# Patient Record
Sex: Male | Born: 1970 | Race: Black or African American | Hispanic: No | Marital: Married | Smoking: Never smoker
Health system: Southern US, Community
[De-identification: ages and names within clinical notes are randomized; demographics above are authoritative.]

## PROBLEM LIST (undated history)

## (undated) DIAGNOSIS — G40909 Epilepsy, unspecified, not intractable, without status epilepticus: Secondary | ICD-10-CM

## (undated) DIAGNOSIS — Z21 Asymptomatic human immunodeficiency virus [HIV] infection status: Secondary | ICD-10-CM

## (undated) DIAGNOSIS — B20 Human immunodeficiency virus [HIV] disease: Secondary | ICD-10-CM

---

## 2015-08-19 ENCOUNTER — Encounter: Payer: Self-pay | Admitting: Emergency Medicine

## 2015-08-19 ENCOUNTER — Ambulatory Visit
Admission: EM | Admit: 2015-08-19 | Discharge: 2015-08-19 | Disposition: A | Discharge status: Home / Self Care | Payer: Self-pay | Attending: Family Medicine | Admitting: Family Medicine

## 2015-08-19 DIAGNOSIS — G40909 Epilepsy, unspecified, not intractable, without status epilepticus: Secondary | ICD-10-CM

## 2015-08-19 DIAGNOSIS — Z21 Asymptomatic human immunodeficiency virus [HIV] infection status: Secondary | ICD-10-CM

## 2015-08-19 DIAGNOSIS — B20 Human immunodeficiency virus [HIV] disease: Secondary | ICD-10-CM

## 2015-08-19 HISTORY — DX: Human immunodeficiency virus (HIV) disease: B20

## 2015-08-19 HISTORY — DX: Epilepsy, unspecified, not intractable, without status epilepticus: G40.909

## 2015-08-19 HISTORY — DX: Asymptomatic human immunodeficiency virus (hiv) infection status: Z21

## 2015-08-19 MED ORDER — DIVALPROEX SODIUM 500 MG PO DR TAB: 500.0000 mg | DELAYED_RELEASE_TABLET | Freq: Two times a day (BID) | ORAL | Status: DC

## 2015-08-19 MED ORDER — DIVALPROEX SODIUM 500 MG PO DR TAB: 500.0000 mg | DELAYED_RELEASE_TABLET | Freq: Three times a day (TID) | ORAL | Status: DC

## 2015-08-19 NOTE — ED Provider Notes (Addendum)
CSN: [id number]     Arrival date & time 08/19/15  1433 History   First MD Initiated Contact with Patient 08/19/15 [phone]      Nurses notes were reviewed.  Chief Complaint  Patient presents with  . Medication Refill   patient is here for medication refill. He's been here for a few months. The left [place] in [place] for a while. Both he and his wife were HIV positive and he does have seizures. He is almost out of his valproic acid medication. Which is a challenge since he normally he takes 300 twice a day. We talked he has been on 500 twice a day before without problems so we are going to go ahead and then placed back on valproic acid. (Consider location/radiation/quality/duration/timing/severity/associated sxs/prior Treatment) HPI  Past Medical History  Diagnosis Date  . Seizure disorder, secondary (HCC)   . HIV (human immunodeficiency virus infection) (HCC)    History reviewed. No pertinent past surgical history. History reviewed. No pertinent family history. Social History  Substance Use Topics  . Smoking status: Never Smoker   . Smokeless tobacco: None  . Alcohol Use: No    Review of Systems  All other systems reviewed and are negative.   Allergies  Review of patient's allergies indicates no known allergies.  Home Medications   Prior to Admission medications   Medication Sig Start Date End Date Taking? Authorizing Provider  divalproex (DEPAKOTE) 500 MG DR tablet Take 1 tablet (500 mg total) by mouth 2 (two) times daily. 08/19/15   Hassan RowanEugene Leslieann Whisman, MD   Meds Ordered and Administered this Visit  Medications - No data to display  BP 124/72 mmHg  Pulse 71  Temp(Src) 97.6 F (36.4 C) (Tympanic)  Resp 16  Ht 5\' 7"  (1.702 m)  Wt 140 lb (63.504 kg)  BMI 21.92 kg/m2  SpO2 100% No data found.   Physical Exam  Constitutional: He is oriented to person, place, and time. He appears well-developed and well-nourished.  HENT:  Head: Normocephalic and atraumatic.  Eyes:  Pupils are equal, round, and reactive to light.  Musculoskeletal: Normal range of motion.  Neurological: He is alert and oriented to person, place, and time.  Skin: Skin is warm.  Psychiatric: He has a normal mood and affect. His behavior is normal.  Vitals reviewed.   ED Course  Procedures (including critical care time)  Labs Review Labs Reviewed - No data to display  Imaging Review No results found.   Visual Acuity Review  Right Eye Distance:   Left Eye Distance:   Bilateral Distance:    Right Eye Near:   Left Eye Near:    Bilateral Near:         MDM   1. Seizure disorder (HCC)   2. HIV (human immunodeficiency virus infection) (HCC)    Patient's follow-up for his HIV with a PCP to get medication at later date.    Hassan RowanEugene Gladis Soley, MD 08/19/15 1554  Hassan RowanEugene Porche Steinberger, MD 08/19/15 [phone]

## 2015-08-19 NOTE — ED Notes (Addendum)
Patient here for refill of medication for his seizures.  Patient states he has come here form [place] 5 months ago. Patient states that he is out of the Epilim CR 600mg  twice a day prescribed by his physician in [place].

## 2015-08-19 NOTE — Discharge Instructions (Signed)
Epilepsy °People with epilepsy have times when they shake and jerk uncontrollably (seizures). This happens when there is a sudden change in brain function. Epilepsy may have many possible causes. Anything that disturbs the normal pattern of brain cell activity can lead to seizures. °HOME CARE  °· Follow your doctor's instructions about driving and safety during normal activities. °· Get enough sleep. °· Only take medicine as told by your doctor. °· Avoid things that you know can cause you to have seizures (triggers). °· Write down when your seizures happen and what you remember about each seizure. Write down anything you think may have caused the seizure to happen. °· Tell the people you live and work with that you have seizures. Make sure they know how to help you. They should: °¨ Cushion your head and body. °¨ Turn you on your side. °¨ Not restrain you. °¨ Not place anything inside your mouth. °¨ Call for local emergency medical help if there is any question about what has happened. °· Keep all follow-up visits with your doctor. This is very important. °GET HELP IF: °· You get an infection or start to feel sick. You may have more seizures when you are sick. °· You are having seizures more often. °· Your seizure pattern is changing. °GET HELP RIGHT AWAY IF:  °· A seizure does not stop after a few seconds or minutes. °· A seizure causes you to have trouble breathing. °· A seizure gives you a very bad headache. °· A seizure makes you unable to speak or use a part of your body. °  °This information is not intended to replace advice given to you by your health care provider. Make sure you discuss any questions you have with your health care provider. °  °Document Released: 08/06/2009 Document Revised: 07/30/2013 Document Reviewed: 05/21/2013 °Elsevier Interactive Patient Education ©2016 Elsevier Inc. ° °

## 2015-09-23 ENCOUNTER — Other Ambulatory Visit: Payer: Self-pay | Admitting: *Deleted

## 2015-09-23 ENCOUNTER — Ambulatory Visit (INDEPENDENT_AMBULATORY_CARE_PROVIDER_SITE_OTHER): Payer: Self-pay

## 2015-09-23 DIAGNOSIS — Z21 Asymptomatic human immunodeficiency virus [HIV] infection status: Secondary | ICD-10-CM

## 2015-09-23 DIAGNOSIS — Z23 Encounter for immunization: Secondary | ICD-10-CM

## 2015-09-23 DIAGNOSIS — G40909 Epilepsy, unspecified, not intractable, without status epilepticus: Secondary | ICD-10-CM

## 2015-09-23 LAB — CBC WITH DIFFERENTIAL/PLATELET
BASOS ABS: 0 10*3/uL (ref 0.0–0.1)
Basophils Relative: 0 % (ref 0–1)
EOS ABS: 0.2 10*3/uL (ref 0.0–0.7)
EOS PCT: 3 % (ref 0–5)
HCT: 40 % (ref 39.0–52.0)
Hemoglobin: 14 g/dL (ref 13.0–17.0)
LYMPHS PCT: 32 % (ref 12–46)
Lymphs Abs: 1.6 10*3/uL (ref 0.7–4.0)
MCH: 32 pg (ref 26.0–34.0)
MCHC: 35 g/dL (ref 30.0–36.0)
MCV: 91.5 fL (ref 78.0–100.0)
MPV: 12.8 fL — AB (ref 8.6–12.4)
Monocytes Absolute: 0.5 10*3/uL (ref 0.1–1.0)
Monocytes Relative: 10 % (ref 3–12)
Neutro Abs: 2.8 10*3/uL (ref 1.7–7.7)
Neutrophils Relative %: 55 % (ref 43–77)
PLATELETS: 109 10*3/uL — AB (ref 150–400)
RBC: 4.37 MIL/uL (ref 4.22–5.81)
RDW: 13.2 % (ref 11.5–15.5)
WBC: 5 10*3/uL (ref 4.0–10.5)

## 2015-09-23 LAB — LIPID PANEL
Cholesterol: 147 mg/dL (ref 125–200)
HDL: 57 mg/dL (ref >=40)
LDL CALC: 75 mg/dL (ref <130)
Total CHOL/HDL Ratio: 2.6 Ratio (ref <=5.0)
Triglycerides: 75 mg/dL (ref <150)
VLDL: 15 mg/dL (ref <30)

## 2015-09-23 LAB — COMPLETE METABOLIC PANEL WITH GFR
ALT: 16 U/L (ref 9–46)
AST: 19 U/L (ref 10–40)
Albumin: 4.1 g/dL (ref 3.6–5.1)
Alkaline Phosphatase: 47 U/L (ref 40–115)
BUN: 11 mg/dL (ref 7–25)
CHLORIDE: 100 mmol/L (ref 98–110)
CO2: 29 mmol/L (ref 20–31)
CREATININE: 0.65 mg/dL (ref 0.60–1.35)
Calcium: 9 mg/dL (ref 8.6–10.3)
GFR, Est African American: >89 mL/min (ref >=60)
GFR, Est Non African American: >89 mL/min (ref >=60)
Glucose, Bld: 76 mg/dL (ref 65–99)
Potassium: 4.2 mmol/L (ref 3.5–5.3)
SODIUM: 138 mmol/L (ref 135–146)
Total Bilirubin: 0.4 mg/dL (ref 0.2–1.2)
Total Protein: 7.3 g/dL (ref 6.1–8.1)

## 2015-09-23 MED ORDER — ELVITEG-COBIC-EMTRICIT-TENOFAF 150-150-200-10 MG PO TABS: 1.0000 | ORAL_TABLET | Freq: Every day | ORAL | Status: DC

## 2015-09-23 MED ORDER — DIVALPROEX SODIUM 500 MG PO DR TAB: 500.0000 mg | DELAYED_RELEASE_TABLET | Freq: Two times a day (BID) | ORAL | Status: DC

## 2015-09-24 LAB — T-HELPER CELL (CD4) - (RCID CLINIC ONLY)
CD4 % Helper T Cell: 28 % — ABNORMAL LOW (ref 33–55)
CD4 T Cell Abs: 460 /uL (ref 400–2700)

## 2015-09-24 LAB — VALPROIC ACID LEVEL: VALPROIC ACID LVL: 86.7 ug/mL (ref 50.0–100.0)

## 2015-09-24 LAB — URINALYSIS
Bilirubin Urine: NEGATIVE
Glucose, UA: NEGATIVE
Hgb urine dipstick: NEGATIVE
LEUKOCYTES UA: NEGATIVE
NITRITE: NEGATIVE
PROTEIN: NEGATIVE
Specific Gravity, Urine: 1.02 (ref 1.001–1.035)
pH: 7.5 (ref 5.0–8.0)

## 2015-09-24 LAB — HEPATITIS B SURFACE ANTIBODY,QUALITATIVE: Hep B S Ab: POSITIVE — AB

## 2015-09-24 LAB — HEPATITIS B SURFACE ANTIGEN: Hepatitis B Surface Ag: NEGATIVE

## 2015-09-24 LAB — HEPATITIS A ANTIBODY, TOTAL: HEP A TOTAL AB: REACTIVE — AB

## 2015-09-24 LAB — HEPATITIS B CORE ANTIBODY, TOTAL: Hep B Core Total Ab: REACTIVE — AB

## 2015-09-24 LAB — URINE CYTOLOGY ANCILLARY ONLY
Chlamydia: NEGATIVE
NEISSERIA GONORRHEA: NEGATIVE

## 2015-09-24 LAB — HEPATITIS C ANTIBODY: HCV AB: NEGATIVE

## 2015-09-25 LAB — RPR

## 2015-09-25 LAB — QUANTIFERON TB GOLD ASSAY (BLOOD)
Interferon Gamma Release Assay: NEGATIVE
Mitogen value: >10 IU/mL
QUANTIFERON TB AG MINUS NIL: 0.15 [IU]/mL
Quantiferon Nil Value: 0.06 IU/mL
TB AG VALUE: 0.21 [IU]/mL

## 2015-09-26 LAB — HIV-1 RNA ULTRAQUANT REFLEX TO GENTYP+
HIV 1 RNA Quant: 157 copies/mL — ABNORMAL HIGH (ref <20)
HIV-1 RNA QUANT, LOG: 2.2 {Log_copies}/mL — AB (ref <1.30)

## 2015-09-30 ENCOUNTER — Telehealth: Payer: Self-pay | Admitting: *Deleted

## 2015-09-30 LAB — HLA B*5701: HLA-B*5701 w/rflx HLA-B High: NEGATIVE

## 2015-09-30 NOTE — Telephone Encounter (Signed)
Patient called, asked if he could be seen by a physician today.  He is reporting all-over aches, chills.  He is on medication, has had his intake with Tomasita Morrowammy King 12/1.  He was scheduled to see Dr. Drue SecondSnider on 12/20.  He feels this is too far, would like something sooner.  Dr. Drue SecondSnider has a new patient appointment available 12/12, 9:00.  Patient accepted appointment, will seek urgent care if his symptoms worsen in the mean time. Andree CossHowell, Jayron Maqueda M, RN

## 2015-10-04 ENCOUNTER — Ambulatory Visit: Payer: Self-pay | Admitting: Internal Medicine

## 2015-10-04 ENCOUNTER — Encounter: Payer: Self-pay | Admitting: Internal Medicine

## 2015-10-04 ENCOUNTER — Ambulatory Visit (INDEPENDENT_AMBULATORY_CARE_PROVIDER_SITE_OTHER): Payer: Self-pay | Admitting: Internal Medicine

## 2015-10-04 VITALS — BP 130/78 | HR 86 | Temp 97.6°F | Wt 139.0 lb

## 2015-10-04 DIAGNOSIS — K219 Gastro-esophageal reflux disease without esophagitis: Secondary | ICD-10-CM

## 2015-10-04 DIAGNOSIS — B20 Human immunodeficiency virus [HIV] disease: Secondary | ICD-10-CM

## 2015-10-04 DIAGNOSIS — R569 Unspecified convulsions: Secondary | ICD-10-CM

## 2015-10-04 NOTE — Progress Notes (Signed)
Patient is from [place] and has been HIV positive since 2011. He was taking the [place] equivalent of Atripla.  He has been without medications for 3 days.  He walked into clinic on 09-22-15 and was given intake appointment for 09-23-15.  He was given medication assistance with [employer] and his medication was change to [place] per [employer], Pharmacist. He has been married since June 2016 and his wife is also HIV positive and was diagnosed in 1997.  Their HIV infections are not related. They were both HIV positive when they met.   They are not using condoms and were counseled  on the risk associated with non condom use among two positive partners. They have agreed to use condoms.   He is in need of Depakote which was called to pharmacy.  Pneumonia vaccine given. Patient refused flu vaccine.

## 2015-10-04 NOTE — Progress Notes (Signed)
Dental clinic referral made.

## 2015-10-04 NOTE — Progress Notes (Signed)
Patient ID: Mitchell Leonard, male   DOB: 1971/09/21, 44 y.o.   MRN: [id number]       Patient ID: Mitchell Leonard, male   DOB: 1971/10/19, 44 y.o.   MRN: [id number]  HPI Mitchell Leonard is a 44yo M who has recently joined his wife (lived here 67 yr [place]) from [place]. Relocated to [facility] in May 2016  In regards to his HIV disease, He was diagnosed in the setting of having a seizure. He was diagnosed in 2011, Cd 4 count 16, nadir. No hx of zoster, no mTB, and no PCP. ? 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 Was started on ART shortly after diagnosis in 2011. Has been on atripla equivalent up until now. Tolerated without difficult. Every 6 month visit in [place]. Most recent labs show Cd 4 count 491/VL<20   Well controlled on depakote.  Vaccines: tdap, ipv, mmr in 01/08/2015  ROS: Tired, nasal congestion, fever, myalgia/arthalgia -> flu like illness. Just starting to feel better. RUQ pain intermittent. Intermittent chest pain associated with certain foods.has gained 7-8 kg since relocating to state. Muscle cramping to right leg.  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 No smoking or no drinking Social History  Substance Use Topics  . Smoking status: Never Smoker   . Smokeless tobacco: None  . Alcohol Use: No   Family hx: no immune deficiencies   Outpatient Encounter Prescriptions as of 10/04/2015  Medication Sig  . divalproex (DEPAKOTE) 500 MG DR tablet Take 1 tablet (500 mg total) by mouth 2 (two) times daily.  Marland Kitchen elvitegravir-cobicistat-emtricitabine-tenofovir (GENVOYA) 150-150-200-10 MG TABS tablet Take 1 tablet by mouth daily with breakfast.   No facility-administered encounter medications on file as of 10/04/2015.    Active Ambulatory Problems    Diagnosis Date Noted  . HIV disease ([place]) 10/21/2015  . Seizures ([place]) 10/21/2015  . GERD (gastroesophageal reflux disease) 10/21/2015   Resolved Ambulatory Problems    Diagnosis Date Noted  . No Resolved Ambulatory  Problems   Past Medical History  Diagnosis Date  . Seizure disorder, secondary ([place])   . HIV (human immunodeficiency virus infection) ([place])      There are no preventive care reminders to display for this patient.   Review of Systems Listed in HPI, otherwise 10 point ros is negative Physical Exam   BP 130/78 mmHg  Pulse 86  Temp(Src) 97.6 F (36.4 C) (Oral)  Wt 139 lb (63.05 kg) Physical Exam  Constitutional: He is oriented to person, place, and time. He appears well-developed and well-nourished. No distress.  HENT:  Mouth/Throat: Oropharynx is clear and moist. No oropharyngeal exudate.  Cardiovascular: Normal rate, regular rhythm and normal heart sounds. Exam reveals no gallop and no friction rub.  No murmur heard.  Pulmonary/Chest: Effort normal and breath sounds normal. No respiratory distress. He has no wheezes.  Abdominal: Soft. Bowel sounds are normal. He exhibits no distension. There is no tenderness.  Lymphadenopathy:  He has no cervical adenopathy.  Neurological: He is alert and oriented to person, place, and time.  Skin: Skin is warm and dry. No rash noted. No erythema.  Psychiatric: He has a normal mood and affect. His behavior is normal.   Labs: CBC    Component Value Date/Time   WBC 5.0 09/23/2015 1341   RBC 4.37 09/23/2015 1341   HGB 14.0 09/23/2015 1341   HCT 40.0 09/23/2015 1341   PLT 109* 09/23/2015 1341   MCV 91.5 09/23/2015 1341   MCH 32.0 09/23/2015  1341   MCHC 35.0 09/23/2015 1341   RDW 13.2 09/23/2015 1341   LYMPHSABS 1.6 09/23/2015 1341   MONOABS 0.5 09/23/2015 1341   EOSABS 0.2 09/23/2015 1341   BASOSABS 0.0 09/23/2015 1341    BMET    Component Value Date/Time   NA 138 09/23/2015 1341   K 4.2 09/23/2015 1341   CL 100 09/23/2015 1341   CO2 29 09/23/2015 1341   GLUCOSE 76 09/23/2015 1341   BUN 11 09/23/2015 1341   CREATININE 0.65 09/23/2015 1341   CALCIUM 9.0 09/23/2015 1341   GFRNONAA >89 09/23/2015 1341   GFRAA >89 09/23/2015  1341    Lab Results  Component Value Date   CD4TCELL 28* 09/23/2015   CD4TABS 460 09/23/2015   Lab Results  Component Value Date   HIV1RNAQUANT 157* 09/23/2015    Assessment and Plan  hiv disease = will switch him to genvoya  Seizure disorder = can continue with depakote since little drug interaction  Health maintenance = already uptodate with pneumococcal and influenza

## 2015-10-05 ENCOUNTER — Telehealth: Payer: Self-pay | Admitting: *Deleted

## 2015-10-05 NOTE — Telephone Encounter (Signed)
Patient called c/o headache and feels it is [facility]. He said he was unable to sleep last night. Mitchell Leonard

## 2015-10-05 NOTE — Telephone Encounter (Signed)
error 

## 2015-10-12 ENCOUNTER — Ambulatory Visit: Payer: Self-pay | Admitting: Internal Medicine

## 2015-10-12 ENCOUNTER — Telehealth: Payer: Self-pay

## 2015-10-12 NOTE — Telephone Encounter (Signed)
LVM for Patient to CB and schedule appt for Financial Counseling during 10/26/2015 - 12/03/2015 

## 2015-10-14 ENCOUNTER — Telehealth: Payer: Self-pay

## 2015-10-21 DIAGNOSIS — B20 Human immunodeficiency virus [HIV] disease: Secondary | ICD-10-CM | POA: Insufficient documentation

## 2015-10-21 DIAGNOSIS — R569 Unspecified convulsions: Secondary | ICD-10-CM | POA: Insufficient documentation

## 2015-10-21 DIAGNOSIS — K219 Gastro-esophageal reflux disease without esophagitis: Secondary | ICD-10-CM | POA: Insufficient documentation

## 2015-11-02 ENCOUNTER — Other Ambulatory Visit: Payer: Self-pay | Admitting: Internal Medicine

## 2015-11-02 ENCOUNTER — Other Ambulatory Visit: Payer: Self-pay

## 2015-11-02 ENCOUNTER — Other Ambulatory Visit: Payer: Self-pay | Admitting: *Deleted

## 2015-11-02 DIAGNOSIS — B20 Human immunodeficiency virus [HIV] disease: Secondary | ICD-10-CM

## 2015-11-02 DIAGNOSIS — G40909 Epilepsy, unspecified, not intractable, without status epilepticus: Secondary | ICD-10-CM

## 2015-11-02 LAB — COMPREHENSIVE METABOLIC PANEL
ALBUMIN: 4.2 g/dL (ref 3.6–5.1)
ALK PHOS: 48 U/L (ref 40–115)
ALT: 15 U/L (ref 9–46)
AST: 16 U/L (ref 10–40)
BUN: 24 mg/dL (ref 7–25)
CALCIUM: 9.1 mg/dL (ref 8.6–10.3)
CHLORIDE: 99 mmol/L (ref 98–110)
CO2: 24 mmol/L (ref 20–31)
Creat: 1.16 mg/dL (ref 0.60–1.35)
Glucose, Bld: 84 mg/dL (ref 65–99)
POTASSIUM: 4.1 mmol/L (ref 3.5–5.3)
Sodium: 136 mmol/L (ref 135–146)
TOTAL PROTEIN: 7.4 g/dL (ref 6.1–8.1)
Total Bilirubin: 0.3 mg/dL (ref 0.2–1.2)

## 2015-11-02 LAB — CBC WITH DIFFERENTIAL/PLATELET
BASOS ABS: 0.1 10*3/uL (ref 0.0–0.1)
BASOS PCT: 1 % (ref 0–1)
Eosinophils Absolute: 0.2 10*3/uL (ref 0.0–0.7)
Eosinophils Relative: 3 % (ref 0–5)
HEMATOCRIT: 39.6 % (ref 39.0–52.0)
HEMOGLOBIN: 13.8 g/dL (ref 13.0–17.0)
LYMPHS PCT: 28 % (ref 12–46)
Lymphs Abs: 2.1 10*3/uL (ref 0.7–4.0)
MCH: 32 pg (ref 26.0–34.0)
MCHC: 34.8 g/dL (ref 30.0–36.0)
MCV: 91.9 fL (ref 78.0–100.0)
MONO ABS: 0.7 10*3/uL (ref 0.1–1.0)
MPV: 12.6 fL — AB (ref 8.6–12.4)
Monocytes Relative: 9 % (ref 3–12)
NEUTROS ABS: 4.4 10*3/uL (ref 1.7–7.7)
NEUTROS PCT: 59 % (ref 43–77)
Platelets: 111 10*3/uL — ABNORMAL LOW (ref 150–400)
RBC: 4.31 MIL/uL (ref 4.22–5.81)
RDW: 13.6 % (ref 11.5–15.5)
WBC: 7.5 10*3/uL (ref 4.0–10.5)

## 2015-11-02 LAB — LIPID PANEL
CHOLESTEROL: 150 mg/dL (ref 125–200)
HDL: 53 mg/dL (ref >=40)
LDL Cholesterol: 69 mg/dL (ref <130)
TRIGLYCERIDES: 139 mg/dL (ref <150)
Total CHOL/HDL Ratio: 2.8 Ratio (ref <=5.0)
VLDL: 28 mg/dL (ref <30)

## 2015-11-02 MED ORDER — DIVALPROEX SODIUM 500 MG PO DR TAB: 500.0000 mg | DELAYED_RELEASE_TABLET | Freq: Two times a day (BID) | ORAL | Status: DC

## 2015-11-02 NOTE — Addendum Note (Signed)
Addended by: Mariea ClontsGREEN, Zakariye Nee D on: 11/02/2015 02:39 PM   Modules accepted: Orders

## 2015-11-03 LAB — RPR

## 2015-11-03 LAB — T-HELPER CELL (CD4) - (RCID CLINIC ONLY)
CD4 T CELL ABS: 440 /uL (ref 400–2700)
CD4 T CELL HELPER: 21 % — AB (ref 33–55)

## 2015-11-03 LAB — HEPATITIS B SURFACE ANTIBODY,QUALITATIVE: Hep B S Ab: POSITIVE — AB

## 2015-11-03 LAB — HEPATITIS C ANTIBODY: HCV AB: NEGATIVE

## 2015-11-03 LAB — HEPATITIS A ANTIBODY, TOTAL: HEP A TOTAL AB: REACTIVE — AB

## 2015-11-03 LAB — HEPATITIS B SURFACE ANTIGEN: HEP B S AG: NEGATIVE

## 2015-11-04 LAB — HIV-1 RNA QUANT-NO REFLEX-BLD
HIV 1 RNA QUANT: 883 {copies}/mL — AB (ref <20)
HIV-1 RNA QUANT, LOG: 2.95 {Log_copies}/mL — AB (ref <1.30)

## 2015-11-04 LAB — CRYPTOCOCCAL AG, LTX SCR RFLX TITER: CRYPTOCOCCAL AG SCREEN: NOT DETECTED

## 2015-11-09 LAB — HLA B*5701: HLA-B 5701 W/RFLX HLA-B HIGH: NEGATIVE

## 2015-11-16 ENCOUNTER — Ambulatory Visit (INDEPENDENT_AMBULATORY_CARE_PROVIDER_SITE_OTHER): Payer: Self-pay | Admitting: Internal Medicine

## 2015-11-16 ENCOUNTER — Encounter: Payer: Self-pay | Admitting: Internal Medicine

## 2015-11-16 VITALS — BP 124/77 | HR 69 | Temp 98.1°F | Wt 141.0 lb

## 2015-11-16 DIAGNOSIS — B20 Human immunodeficiency virus [HIV] disease: Secondary | ICD-10-CM

## 2015-11-16 DIAGNOSIS — G40909 Epilepsy, unspecified, not intractable, without status epilepticus: Secondary | ICD-10-CM

## 2015-11-16 DIAGNOSIS — Z23 Encounter for immunization: Secondary | ICD-10-CM

## 2015-11-16 NOTE — Progress Notes (Signed)
Patient ID: Shelle Leonard, male   DOB: 1971-06-21, 45 y.o.   MRN: [id number]       Patient ID: Mitchell Leonard, male   DOB: [birth year], 45 y.o.   MRN: [id number]  HPI 45yo M with HIV disease, hx of seizure disorder. First seen in clinic in December to establish care. He was switched to genvoya, kept on depakote.Cd 4 count of 440/VL 886. This is roughly 2-3 wk on genvoya. Up from 125 VL on old regimen. he was temporarily out of meds during this time period, and had just started genvoya x 1 wk, thus lab drawn too soon. He has taken meds every day doing well with adherence  Outpatient Encounter Prescriptions as of 11/16/2015  Medication Sig  . divalproex (DEPAKOTE) 500 MG DR tablet Take 1 tablet (500 mg total) by mouth 2 (two) times daily.  Marland Kitchen elvitegravir-cobicistat-emtricitabine-tenofovir (GENVOYA) 150-150-200-10 MG TABS tablet Take 1 tablet by mouth daily with breakfast.  . [DISCONTINUED] elvitegravir-cobicistat-emtricitabine-tenofovir (GENVOYA) 150-150-200-10 MG TABS tablet Take 1 tablet by mouth daily with breakfast.   No facility-administered encounter medications on file as of 11/16/2015.     Patient Active Problem List   Diagnosis Date Noted  . HIV disease (HCC) 10/21/2015  . Seizures (HCC) 10/21/2015  . GERD (gastroesophageal reflux disease) 10/21/2015     Health Maintenance Due  Topic Date Due  . INFLUENZA VACCINE  05/24/2015     Review of Systems 10 point ros is negative Physical Exam   BP 124/77 mmHg  Pulse 69  Temp(Src) 98.1 F (36.7 C) (Oral)  Wt 141 lb (63.957 kg) Physical Exam  Constitutional: He is oriented to person, place, and time. He appears well-developed and well-nourished. No distress.  HENT:  Mouth/Throat: Oropharynx is clear and moist. No oropharyngeal exudate.  Cardiovascular: Normal rate, regular rhythm and normal heart sounds. Exam reveals no gallop and no friction rub.  No murmur heard.  Pulmonary/Chest: Effort normal and breath sounds normal. No  respiratory distress. He has no wheezes.  Abdominal: Soft. Bowel sounds are normal. He exhibits no distension. There is no tenderness.  Lymphadenopathy:  He has no cervical adenopathy.  Neurological: He is alert and oriented to person, place, and time.  Skin: Skin is warm and dry. No rash noted. No erythema.  Psychiatric: He has a normal mood and affect. His behavior is normal.    Lab Results  Component Value Date   CD4TCELL 21* 11/02/2015   Lab Results  Component Value Date   CD4TABS 440 11/02/2015   CD4TABS 460 09/23/2015   Lab Results  Component Value Date   HIV1RNAQUANT 883* 11/02/2015   Lab Results  Component Value Date   HEPBSAB POS* 11/02/2015   No results found for: RPR  CBC Lab Results  Component Value Date   WBC 7.5 11/02/2015   RBC 4.31 11/02/2015   HGB 13.8 11/02/2015   HCT 39.6 11/02/2015   PLT 111* 11/02/2015   MCV 91.9 11/02/2015   MCH 32.0 11/02/2015   MCHC 34.8 11/02/2015   RDW 13.6 11/02/2015   LYMPHSABS 2.1 11/02/2015   MONOABS 0.7 11/02/2015   EOSABS 0.2 11/02/2015   BASOSABS 0.1 11/02/2015   BMET Lab Results  Component Value Date   NA 136 11/02/2015   K 4.1 11/02/2015   CL 99 11/02/2015   CO2 24 11/02/2015   GLUCOSE 84 11/02/2015   BUN 24 11/02/2015   CREATININE 1.16 11/02/2015   CALCIUM 9.1 11/02/2015   GFRNONAA >89 09/23/2015   GFRAA >89 09/23/2015  Assessment and Plan hiv disease = plan on continue with genvoya. Will repeat vl in 6 wk, rtc in 8 wk to discuss if need to change regimen  Seizure d/o = he is not having any difficulty with depakote. Plan to continue  Health maintenance = will give flu vaccination  rtc in 2 mo

## 2015-11-17 NOTE — Telephone Encounter (Signed)
spoke with pt regarding Pended ADAP application; [employer] req a copy of spouse's insurance card in order to approve; per pt he will send copy to my email

## 2015-12-28 ENCOUNTER — Other Ambulatory Visit: Payer: Self-pay

## 2016-01-10 ENCOUNTER — Other Ambulatory Visit (INDEPENDENT_AMBULATORY_CARE_PROVIDER_SITE_OTHER): Payer: Self-pay

## 2016-01-10 DIAGNOSIS — B20 Human immunodeficiency virus [HIV] disease: Secondary | ICD-10-CM

## 2016-01-12 LAB — HIV-1 RNA QUANT-NO REFLEX-BLD
HIV 1 RNA Quant: 77 copies/mL — ABNORMAL HIGH (ref <20)
HIV-1 RNA Quant, Log: 1.89 Log copies/mL — ABNORMAL HIGH (ref <1.30)

## 2016-01-18 ENCOUNTER — Ambulatory Visit: Payer: Self-pay | Admitting: Internal Medicine

## 2016-01-25 ENCOUNTER — Ambulatory Visit: Payer: Self-pay | Admitting: Internal Medicine

## 2016-02-10 ENCOUNTER — Ambulatory Visit: Payer: Self-pay | Admitting: Internal Medicine

## 2016-03-01 ENCOUNTER — Ambulatory Visit (INDEPENDENT_AMBULATORY_CARE_PROVIDER_SITE_OTHER): Payer: Self-pay | Admitting: Internal Medicine

## 2016-03-01 ENCOUNTER — Encounter: Payer: Self-pay | Admitting: Internal Medicine

## 2016-03-01 VITALS — BP 136/86 | HR 90 | Temp 98.3°F | Ht 67.0 in | Wt 143.0 lb

## 2016-03-01 DIAGNOSIS — B20 Human immunodeficiency virus [HIV] disease: Secondary | ICD-10-CM

## 2016-03-01 DIAGNOSIS — L708 Other acne: Secondary | ICD-10-CM

## 2016-03-01 DIAGNOSIS — G40909 Epilepsy, unspecified, not intractable, without status epilepticus: Secondary | ICD-10-CM

## 2016-03-01 NOTE — Progress Notes (Signed)
Patient ID: Mitchell Leonard, male   DOB: 12-05-1970, 45 y.o.   MRN: [id number]       Patient ID: Mitchell Leonard, male   DOB: 12-05-1970, 45 y.o.   MRN: [id number]  HPI CD 4 count of 440/VL 77 (march 2017) currently on genvoya. Doing well with his health. He reports having a spell possibly having right hand neuropathy and right leg weakness that last 30 minutes at most. This previously occurred for many years. He states that it feels somewhat similar to when he has seizures, which he describes start localized on right arm/leg then become generalized tonic-clonic seizures. He denies any seizure activity. He reports it occurs once in several months  Outpatient Encounter Prescriptions as of 03/01/2016  Medication Sig  . divalproex (DEPAKOTE) 500 MG DR tablet Take 1 tablet (500 mg total) by mouth 2 (two) times daily.  Marland Kitchen. elvitegravir-cobicistat-emtricitabine-tenofovir (GENVOYA) 150-150-200-10 MG TABS tablet Take 1 tablet by mouth daily with breakfast.   No facility-administered encounter medications on file as of 03/01/2016.     Patient Active Problem List   Diagnosis Date Noted  . HIV disease (HCC) 10/21/2015  . Seizures (HCC) 10/21/2015  . GERD (gastroesophageal reflux disease) 10/21/2015     There are no preventive care reminders to display for this patient.   Review of Systems Per hpi, otherwise 10 point ros is negative Physical Exam   BP 136/86 mmHg  Pulse 90  Temp(Src) 98.3 F (36.8 C) (Oral)  Ht 5\' 7"  (1.702 m)  Wt 143 lb (64.864 kg)  BMI 22.39 kg/m2 Physical Exam  Constitutional: He is oriented to person, place, and time. He appears well-developed and well-nourished. No distress.  HENT:  Mouth/Throat: Oropharynx is clear and moist. No oropharyngeal exudate.  Cardiovascular: Normal rate, regular rhythm and normal heart sounds. Exam reveals no gallop and no friction rub.  No murmur heard.  Pulmonary/Chest: Effort normal and breath sounds normal. No respiratory distress. He has  no wheezes.  Abdominal: Soft. Bowel sounds are normal. He exhibits no distension. There is no tenderness.  Lymphadenopathy:  He has no cervical adenopathy.  Neurological: He is alert and oriented to person, place, and time.  Skin: Skin is warm and dry. No rash noted. No erythema.  Psychiatric: He has a normal mood and affect. His behavior is normal.    Lab Results  Component Value Date   CD4TCELL 21* 11/02/2015   Lab Results  Component Value Date   CD4TABS 440 11/02/2015   CD4TABS 460 09/23/2015   Lab Results  Component Value Date   HIV1RNAQUANT 77* 01/10/2016   Lab Results  Component Value Date   HEPBSAB POS* 11/02/2015   No results found for: RPR  CBC Lab Results  Component Value Date   WBC 7.5 11/02/2015   RBC 4.31 11/02/2015   HGB 13.8 11/02/2015   HCT 39.6 11/02/2015   PLT 111* 11/02/2015   MCV 91.9 11/02/2015   MCH 32.0 11/02/2015   MCHC 34.8 11/02/2015   RDW 13.6 11/02/2015   LYMPHSABS 2.1 11/02/2015   MONOABS 0.7 11/02/2015   EOSABS 0.2 11/02/2015   BASOSABS 0.1 11/02/2015   BMET Lab Results  Component Value Date   NA 136 11/02/2015   K 4.1 11/02/2015   CL 99 11/02/2015   CO2 24 11/02/2015   GLUCOSE 84 11/02/2015   BUN 24 11/02/2015   CREATININE 1.16 11/02/2015   CALCIUM 9.1 11/02/2015   GFRNONAA >89 09/23/2015   GFRAA >89 09/23/2015     Assessment and Plan  Spell = unclear if it could be partial seizure vs. TIA. No loss of consciousness. Will start him on asa . i have asked him to track these episodes. If another episode occurs, to contact clinic, may need to refer to ED for stroke work up if symptoms appears similar to one. For now I am leaning towards partial seizures. If he has more frequent episodes, will need to refer to neurology  He is starting to work and possibly will have health insurance in a few months  hiv disease= continue on genvoya, will repeat labs 2 wk prior to next visit  Acne = will recommend benzoyl-peroxide otc  cream to use on skin lesions

## 2016-04-26 ENCOUNTER — Other Ambulatory Visit: Payer: Self-pay | Admitting: *Deleted

## 2016-04-26 ENCOUNTER — Ambulatory Visit: Payer: Self-pay

## 2016-04-26 DIAGNOSIS — G40909 Epilepsy, unspecified, not intractable, without status epilepticus: Secondary | ICD-10-CM

## 2016-04-26 DIAGNOSIS — B20 Human immunodeficiency virus [HIV] disease: Secondary | ICD-10-CM

## 2016-04-26 MED ORDER — DIVALPROEX SODIUM 500 MG PO DR TAB: 500.0000 mg | DELAYED_RELEASE_TABLET | Freq: Two times a day (BID) | ORAL | Status: DC

## 2016-04-26 MED ORDER — ELVITEG-COBIC-EMTRICIT-TENOFAF 150-150-200-10 MG PO TABS: 1.0000 | ORAL_TABLET | Freq: Every day | ORAL | Status: DC

## 2016-06-08 ENCOUNTER — Encounter: Payer: Self-pay | Admitting: Internal Medicine

## 2016-06-08 ENCOUNTER — Other Ambulatory Visit: Payer: Self-pay | Admitting: *Deleted

## 2016-06-08 ENCOUNTER — Ambulatory Visit (INDEPENDENT_AMBULATORY_CARE_PROVIDER_SITE_OTHER): Payer: Self-pay | Admitting: Internal Medicine

## 2016-06-08 VITALS — BP 113/70 | HR 70 | Temp 98.0°F | Ht 67.32 in | Wt 151.7 lb

## 2016-06-08 DIAGNOSIS — B20 Human immunodeficiency virus [HIV] disease: Secondary | ICD-10-CM

## 2016-06-08 LAB — COMPLETE METABOLIC PANEL WITH GFR
ALT: 18 U/L (ref 9–46)
AST: 17 U/L (ref 10–40)
Albumin: 4.1 g/dL (ref 3.6–5.1)
Alkaline Phosphatase: 47 U/L (ref 40–115)
BUN: 14 mg/dL (ref 7–25)
CHLORIDE: 104 mmol/L (ref 98–110)
CO2: 24 mmol/L (ref 20–31)
CREATININE: 0.75 mg/dL (ref 0.60–1.35)
Calcium: 8.9 mg/dL (ref 8.6–10.3)
GFR, Est Non African American: >89 mL/min (ref >=60)
GLUCOSE: 84 mg/dL (ref 65–99)
Potassium: 4.3 mmol/L (ref 3.5–5.3)
SODIUM: 139 mmol/L (ref 135–146)
Total Bilirubin: 0.2 mg/dL (ref 0.2–1.2)
Total Protein: 7.3 g/dL (ref 6.1–8.1)

## 2016-06-08 MED ORDER — ELVITEG-COBIC-EMTRICIT-TENOFAF 150-150-200-10 MG PO TABS: 1.0000 | ORAL_TABLET | Freq: Every day | ORAL | 5 refills | Status: DC

## 2016-06-08 NOTE — Progress Notes (Signed)
Patient ID: Mitchell Leonard, male   DOB: August 10, 1971, 45 y.o.   MRN: [id number]  HPI Mitchell Leonard is 45 yo M with well controlled hiv disease as well as well controlled seizures. He reports that his insurance is running out.Doesn't qualify for adap but enrolling for aca is cost prohibitive Outpatient Encounter Prescriptions as of 06/08/2016  Medication Sig  . divalproex (DEPAKOTE) 500 MG DR tablet Take 1 tablet (500 mg total) by mouth 2 (two) times daily.  Marland Kitchen. elvitegravir-cobicistat-emtricitabine-tenofovir (GENVOYA) 150-150-200-10 MG TABS tablet Take 1 tablet by mouth daily with breakfast.   No facility-administered encounter medications on file as of 06/08/2016.      Patient Active Problem List   Diagnosis Date Noted  . HIV disease (HCC) 10/21/2015  . Seizures (HCC) 10/21/2015  . GERD (gastroesophageal reflux disease) 10/21/2015     Health Maintenance Due  Topic Date Due  . INFLUENZA VACCINE  05/23/2016     Review of Systems Review of Systems  Constitutional: Negative for fever, chills, diaphoresis, activity change, appetite change, fatigue and unexpected weight change.  HENT: Negative for congestion, sore throat, rhinorrhea, sneezing, trouble swallowing and sinus pressure.  Eyes: Negative for photophobia and visual disturbance.  Respiratory: Negative for cough, chest tightness, shortness of breath, wheezing and stridor.  Cardiovascular: Negative for chest pain, palpitations and leg swelling.  Gastrointestinal: Negative for nausea, vomiting, abdominal pain, diarrhea, constipation, blood in stool, abdominal distention and anal bleeding.  Genitourinary: Negative for dysuria, hematuria, flank pain and difficulty urinating.  Musculoskeletal: Negative for myalgias, back pain, joint swelling, arthralgias and gait problem.  Skin: Negative for color change, pallor, rash and wound.  Neurological: Negative for dizziness, tremors, weakness and light-headedness.  Hematological: Negative for  adenopathy. Does not bruise/bleed easily.  Psychiatric/Behavioral: Negative for behavioral problems, confusion, sleep disturbance, dysphoric mood, decreased concentration and agitation.    Physical Exam   BP 113/70   Pulse 70   Temp 98 F (36.7 C)   Ht 5' 7.32" (1.71 m)   Wt 151 lb 10.8 oz (68.8 kg)   SpO2 99%   BMI 23.53 kg/m  Physical Exam  Constitutional: He is oriented to person, place, and time. He appears well-developed and well-nourished. No distress.  HENT:  Mouth/Throat: Oropharynx is clear and moist. No oropharyngeal exudate.  Cardiovascular: Normal rate, regular rhythm and normal heart sounds. Exam reveals no gallop and no friction rub.  No murmur heard.  Pulmonary/Chest: Effort normal and breath sounds normal. No respiratory distress. He has no wheezes.  Abdominal: Soft. Bowel sounds are normal. He exhibits no distension. There is no tenderness.  Lymphadenopathy:  He has no cervical adenopathy.  Neurological: He is alert and oriented to person, place, and time.  Skin: Skin is warm and dry. No rash noted. No erythema.  Psychiatric: He has a normal mood and affect. His behavior is normal.    Lab Results  Component Value Date   CD4TCELL 21 (L) 11/02/2015   Lab Results  Component Value Date   CD4TABS 440 11/02/2015   CD4TABS 460 09/23/2015   Lab Results  Component Value Date   HIV1RNAQUANT 77 (H) 01/10/2016   Lab Results  Component Value Date   HEPBSAB POS (A) 11/02/2015   No results found for: RPR  CBC Lab Results  Component Value Date   WBC 7.5 11/02/2015   RBC 4.31 11/02/2015   HGB 13.8 11/02/2015   HCT 39.6 11/02/2015   PLT 111 (L) 11/02/2015   MCV 91.9 11/02/2015   MCH 32.0  11/02/2015   MCHC 34.8 11/02/2015   RDW 13.6 11/02/2015   LYMPHSABS 2.1 11/02/2015   MONOABS 0.7 11/02/2015   EOSABS 0.2 11/02/2015   BASOSABS 0.1 11/02/2015   BMET Lab Results  Component Value Date   NA 136 11/02/2015   K 4.1 11/02/2015   CL 99 11/02/2015   CO2  24 11/02/2015   GLUCOSE 84 11/02/2015   BUN 24 11/02/2015   CREATININE 1.16 11/02/2015   CALCIUM 9.1 11/02/2015   GFRNONAA >89 09/23/2015   GFRAA >89 09/23/2015     Assessment and Plan  hiv disease= well controlled, continue on current regimen  Seizure = well controlled  Access to care = will refer to financial counseling  Health maintenance = flu vac in the next month

## 2016-06-09 LAB — CBC WITH DIFFERENTIAL/PLATELET
BASOS PCT: 1 %
Basophils Absolute: 61 cells/uL (ref 0–200)
Eosinophils Absolute: 183 cells/uL (ref 15–500)
Eosinophils Relative: 3 %
HCT: 41.9 % (ref 38.5–50.0)
Hemoglobin: 13.9 g/dL (ref 13.2–17.1)
LYMPHS PCT: 35 %
Lymphs Abs: 2135 cells/uL (ref 850–3900)
MCH: 31.3 pg (ref 27.0–33.0)
MCHC: 33.2 g/dL (ref 32.0–36.0)
MCV: 94.4 fL (ref 80.0–100.0)
MONOS PCT: 11 %
MPV: 12.8 fL — AB (ref 7.5–12.5)
Monocytes Absolute: 671 cells/uL (ref 200–950)
NEUTROS ABS: 3050 {cells}/uL (ref 1500–7800)
Neutrophils Relative %: 50 %
PLATELETS: 90 10*3/uL — AB (ref 140–400)
RBC: 4.44 MIL/uL (ref 4.20–5.80)
RDW: 13.5 % (ref 11.0–15.0)
WBC: 6.1 10*3/uL (ref 3.8–10.8)

## 2016-06-09 LAB — T-HELPER CELL (CD4) - (RCID CLINIC ONLY)
CD4 % Helper T Cell: 25 % — ABNORMAL LOW (ref 33–55)
CD4 T Cell Abs: 540 /uL (ref 400–2700)

## 2016-06-12 LAB — HIV-1 RNA QUANT-NO REFLEX-BLD
HIV 1 RNA Quant: 37 copies/mL — ABNORMAL HIGH (ref <20)
HIV-1 RNA QUANT, LOG: 1.57 {Log_copies}/mL — AB (ref <1.30)

## 2016-09-05 ENCOUNTER — Ambulatory Visit (INDEPENDENT_AMBULATORY_CARE_PROVIDER_SITE_OTHER): Payer: Self-pay | Admitting: Internal Medicine

## 2016-09-05 ENCOUNTER — Encounter: Payer: Self-pay | Admitting: Internal Medicine

## 2016-09-05 VITALS — BP 127/84 | HR 74 | Temp 97.6°F | Wt 150.0 lb

## 2016-09-05 DIAGNOSIS — Z23 Encounter for immunization: Secondary | ICD-10-CM

## 2016-09-05 DIAGNOSIS — B20 Human immunodeficiency virus [HIV] disease: Secondary | ICD-10-CM

## 2016-09-05 DIAGNOSIS — L282 Other prurigo: Secondary | ICD-10-CM

## 2016-09-05 DIAGNOSIS — R569 Unspecified convulsions: Secondary | ICD-10-CM
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
## 2016-10-18 ENCOUNTER — Other Ambulatory Visit: Payer: Self-pay | Admitting: *Deleted

## 2016-10-18 DIAGNOSIS — B20 Human immunodeficiency virus [HIV] disease: Secondary | ICD-10-CM

## 2016-10-18 DIAGNOSIS — G40909 Epilepsy, unspecified, not intractable, without status epilepticus: Secondary | ICD-10-CM

## 2016-10-18 MED ORDER — ELVITEG-COBIC-EMTRICIT-TENOFAF 150-150-200-10 MG PO TABS: 1.0000 | ORAL_TABLET | Freq: Every day | ORAL | 5 refills | Status: DC

## 2016-10-18 MED ORDER — DIVALPROEX SODIUM 500 MG PO DR TAB: 500.0000 mg | DELAYED_RELEASE_TABLET | Freq: Two times a day (BID) | ORAL | 5 refills | Status: DC

## 2016-12-06 ENCOUNTER — Ambulatory Visit: Payer: BLUE CROSS/BLUE SHIELD | Admitting: Internal Medicine

## 2016-12-07 ENCOUNTER — Encounter: Payer: Self-pay | Admitting: Internal Medicine

## 2016-12-07 ENCOUNTER — Other Ambulatory Visit: Payer: Self-pay | Admitting: Pharmacist

## 2016-12-07 ENCOUNTER — Ambulatory Visit (INDEPENDENT_AMBULATORY_CARE_PROVIDER_SITE_OTHER): Payer: BLUE CROSS/BLUE SHIELD | Admitting: Internal Medicine

## 2016-12-07 VITALS — BP 123/77 | HR 70 | Temp 97.7°F | Wt 148.0 lb

## 2016-12-07 DIAGNOSIS — G40909 Epilepsy, unspecified, not intractable, without status epilepticus: Secondary | ICD-10-CM | POA: Diagnosis not present

## 2016-12-07 DIAGNOSIS — B20 Human immunodeficiency virus [HIV] disease: Secondary | ICD-10-CM

## 2016-12-07 DIAGNOSIS — R1011 Right upper quadrant pain: Secondary | ICD-10-CM

## 2016-12-07 DIAGNOSIS — Z23 Encounter for immunization: Secondary | ICD-10-CM

## 2016-12-07 DIAGNOSIS — G8929 Other chronic pain: Secondary | ICD-10-CM

## 2016-12-07 LAB — CBC WITH DIFFERENTIAL/PLATELET
BASOS ABS: 0 {cells}/uL (ref 0–200)
Basophils Relative: 0 %
EOS ABS: 120 {cells}/uL (ref 15–500)
Eosinophils Relative: 2 %
HEMATOCRIT: 44.7 % (ref 38.5–50.0)
HEMOGLOBIN: 15 g/dL (ref 13.2–17.1)
LYMPHS ABS: 1860 {cells}/uL (ref 850–3900)
Lymphocytes Relative: 31 %
MCH: 31.8 pg (ref 27.0–33.0)
MCHC: 33.6 g/dL (ref 32.0–36.0)
MCV: 94.9 fL (ref 80.0–100.0)
MONO ABS: 720 {cells}/uL (ref 200–950)
MONOS PCT: 12 %
MPV: 12.8 fL — ABNORMAL HIGH (ref 7.5–12.5)
NEUTROS ABS: 3300 {cells}/uL (ref 1500–7800)
Neutrophils Relative %: 55 %
Platelets: 111 10*3/uL — ABNORMAL LOW (ref 140–400)
RBC: 4.71 MIL/uL (ref 4.20–5.80)
RDW: 13.6 % (ref 11.0–15.0)
WBC: 6 10*3/uL (ref 3.8–10.8)

## 2016-12-07 LAB — COMPLETE METABOLIC PANEL WITH GFR
ALBUMIN: 4.4 g/dL (ref 3.6–5.1)
ALK PHOS: 58 U/L (ref 40–115)
ALT: 17 U/L (ref 9–46)
AST: 16 U/L (ref 10–40)
BILIRUBIN TOTAL: 0.3 mg/dL (ref 0.2–1.2)
BUN: 12 mg/dL (ref 7–25)
CALCIUM: 9.4 mg/dL (ref 8.6–10.3)
CO2: 26 mmol/L (ref 20–31)
CREATININE: 0.73 mg/dL (ref 0.60–1.35)
Chloride: 102 mmol/L (ref 98–110)
Glucose, Bld: 71 mg/dL (ref 65–99)
Potassium: 4.4 mmol/L (ref 3.5–5.3)
Sodium: 140 mmol/L (ref 135–146)
TOTAL PROTEIN: 7.9 g/dL (ref 6.1–8.1)

## 2016-12-07 LAB — LIPID PANEL
CHOL/HDL RATIO: 3.8 ratio (ref <5.0)
Cholesterol: 181 mg/dL (ref <200)
HDL: 48 mg/dL (ref >40)
LDL CALC: 111 mg/dL — AB (ref <100)
Triglycerides: 111 mg/dL (ref <150)
VLDL: 22 mg/dL (ref <30)

## 2016-12-07 MED ORDER — DIVALPROEX SODIUM ER 500 MG PO TB24: 1000.0000 mg | ORAL_TABLET | Freq: Every day | ORAL | 5 refills | Status: DC

## 2016-12-07 MED ORDER — MENINGOCOCCAL A C Y&W-135 OLIG IM SOLR
0.5000 mL | Freq: Once | INTRAMUSCULAR | Status: AC
  Administered 2016-12-07: 0.5 mL via INTRAMUSCULAR

## 2016-12-07 NOTE — Progress Notes (Signed)
RFV: hiv disease follow up  Patient ID: Mitchell Leonard, male   DOB: 08-25-1971, 46 y.o.   MRN: [id number]  HPI  Mitchell Leonard isa 46yo male with well controlled hiv disease. CD 4 count of 540/VL<20 in aug 2017, currently on genvoya. He reports that he is doing well with adherence. He does notice occasional pruritic rash to legs as well as has occasional RUQ pain that is sporadic occurred or a few years. He states that it is not associated with passing gas, constipation, or bloating r with eating. Not thought to be due to muscular strain, but other than that has no complaints. He also would like to change his AED formulation for seizure mgmt Outpatient Encounter Prescriptions as of 12/07/2016  Medication Sig  . elvitegravir-cobicistat-emtricitabine-tenofovir (GENVOYA) 150-150-200-10 MG TABS tablet Take 1 tablet by mouth daily with breakfast.  . [DISCONTINUED] divalproex (DEPAKOTE) 500 MG DR tablet Take 1 tablet (500 mg total) by mouth 2 (two) times daily.   No facility-administered encounter medications on file as of 12/07/2016.      Patient Active Problem List   Diagnosis Date Noted  . HIV disease (HCC) 10/21/2015  . Seizures (HCC) 10/21/2015  . GERD (gastroesophageal reflux disease) 10/21/2015     There are no preventive care reminders to display for this patient.   Review of Systems 10 points are negative except what is mentioned in hpi Physical Exam   BP 123/77   Pulse 70   Temp 97.7 F (36.5 C) (Oral)   Wt 148 lb (67.1 kg)   BMI 22.96 kg/m   Physical Exam  Constitutional: He is oriented to person, place, and time. He appears well-developed and well-nourished. No distress.  HENT:  Mouth/Throat: Oropharynx is clear and moist. No oropharyngeal exudate.  Cardiovascular: Normal rate, regular rhythm and normal heart sounds. Exam reveals no gallop and no friction rub.  No murmur heard.  Pulmonary/Chest: Effort normal and breath sounds normal. No respiratory distress. He has no  wheezes.  Abdominal: Soft. Bowel sounds are normal. He exhibits no distension. There is no tenderness.  Lymphadenopathy:  He has no cervical adenopathy.  Neurological: He is alert and oriented to person, place, and time.  Skin: Skin is warm and dry. No rash noted. No erythema.  Psychiatric: He has a normal mood and affect. His behavior is normal.    Lab Results  Component Value Date   CD4TCELL 25 (L) 06/08/2016   Lab Results  Component Value Date   CD4TABS 540 06/08/2016   CD4TABS 440 11/02/2015   CD4TABS 460 09/23/2015   Lab Results  Component Value Date   HIV1RNAQUANT 37 (H) 06/08/2016   Lab Results  Component Value Date   HEPBSAB POS (A) 11/02/2015   Lab Results  Component Value Date   LABRPR NON REAC 11/02/2015    CBC Lab Results  Component Value Date   WBC 6.1 06/08/2016   RBC 4.44 06/08/2016   HGB 13.9 06/08/2016   HCT 41.9 06/08/2016   PLT 90 (L) 06/08/2016   MCV 94.4 06/08/2016   MCH 31.3 06/08/2016   MCHC 33.2 06/08/2016   RDW 13.5 06/08/2016   LYMPHSABS 2,135 06/08/2016   MONOABS 671 06/08/2016   EOSABS 183 06/08/2016    BMET Lab Results  Component Value Date   NA 139 06/08/2016   K 4.3 06/08/2016   CL 104 06/08/2016   CO2 24 06/08/2016   GLUCOSE 84 06/08/2016   BUN 14 06/08/2016   CREATININE 0.75 06/08/2016   CALCIUM 8.9  06/08/2016   GFRNONAA >89 06/08/2016   GFRAA >89 06/08/2016      Assessment and Plan  hiv disease= will refill genvoya. Check lab work to see if still well controlled  Seizure = will change depakote to ER dosing  RUQ pain = occasional. Will check cmp. Physical exam is benign. depeing on cmp may need limited RUQ u/s  Health maintenance =will check rpr, lipid profile. Give meningococcal vaccine

## 2016-12-08 LAB — RPR

## 2016-12-08 LAB — T-HELPER CELL (CD4) - (RCID CLINIC ONLY)
CD4 T CELL ABS: 530 /uL (ref 400–2700)
CD4 T CELL HELPER: 27 % — AB (ref 33–55)

## 2016-12-14 ENCOUNTER — Ambulatory Visit: Payer: Self-pay | Admitting: Internal Medicine

## 2016-12-14 LAB — HIV-1 RNA QUANT-NO REFLEX-BLD
HIV 1 RNA Quant: <20 copies/mL
HIV-1 RNA QUANT, LOG: NOT DETECTED {Log_copies}/mL

## 2017-01-01 ENCOUNTER — Other Ambulatory Visit: Payer: Self-pay | Admitting: *Deleted

## 2017-01-01 DIAGNOSIS — B20 Human immunodeficiency virus [HIV] disease: Secondary | ICD-10-CM

## 2017-01-01 MED ORDER — ELVITEG-COBIC-EMTRICIT-TENOFAF 150-150-200-10 MG PO TABS: 1.0000 | ORAL_TABLET | Freq: Every day | ORAL | 5 refills | Status: DC

## 2017-03-06 ENCOUNTER — Ambulatory Visit (INDEPENDENT_AMBULATORY_CARE_PROVIDER_SITE_OTHER): Payer: BLUE CROSS/BLUE SHIELD | Admitting: Internal Medicine

## 2017-03-06 ENCOUNTER — Encounter: Payer: Self-pay | Admitting: Internal Medicine

## 2017-03-06 VITALS — BP 113/77 | HR 68 | Temp 97.9°F | Ht 67.0 in | Wt 144.0 lb

## 2017-03-06 DIAGNOSIS — G8929 Other chronic pain: Secondary | ICD-10-CM

## 2017-03-06 DIAGNOSIS — R569 Unspecified convulsions: Secondary | ICD-10-CM

## 2017-03-06 DIAGNOSIS — R1011 Right upper quadrant pain: Secondary | ICD-10-CM

## 2017-03-06 DIAGNOSIS — B20 Human immunodeficiency virus [HIV] disease: Secondary | ICD-10-CM | POA: Diagnosis not present

## 2017-03-06 NOTE — Progress Notes (Signed)
RF: follow up for HIV disease  Patient ID: Mitchell Leonard, male   DOB: 03-24-1971, 46 y.o.   MRN: [id number]  HPI 46yo M with HIV disease-seizure disorder. CD 4 count of 530/VL<20 on genvoya. Mitchell Leonard states he is doing well with adherence and recently had an episode of chest discomfort where he saught evaluation that ruled out ACS and thought to be either MSK vs. Genella RifeGerd. His symptoms are improved. He also states that he has ongoing right upper quadrant pain that has no pattern to onset of pain. It is not triggered by food/digestive tract or positional. He has mentioned this in the past where we defered any work up since it was thought to be msk related.  Outpatient Encounter Prescriptions as of 03/06/2017  Medication Sig  . divalproex (DEPAKOTE ER) 500 MG 24 hr tablet Take 2 tablets (1,000 mg total) by mouth daily.  Marland Kitchen. elvitegravir-cobicistat-emtricitabine-tenofovir (GENVOYA) 150-150-200-10 MG TABS tablet Take 1 tablet by mouth daily with breakfast.   No facility-administered encounter medications on file as of 03/06/2017.      Patient Active Problem List   Diagnosis Date Noted  . HIV disease (HCC) 10/21/2015  . Seizures (HCC) 10/21/2015  . GERD (gastroesophageal reflux disease) 10/21/2015     There are no preventive care reminders to display for this patient.   Review of Systems Review of Systems  Constitutional: Negative for fever, chills, diaphoresis, activity change, appetite change, fatigue and unexpected weight change.  HENT: Negative for congestion, sore throat, rhinorrhea, sneezing, trouble swallowing and sinus pressure.  Eyes: Negative for photophobia and visual disturbance.  Respiratory: Negative for cough, chest tightness, shortness of breath, wheezing and stridor.  Cardiovascular: Negative for chest pain, palpitations and leg swelling.  Gastrointestinal:+ occasional RUQ pain. Negative for nausea, vomiting, abdominal pain, diarrhea, constipation, blood in stool,  abdominal distention and anal bleeding.  Genitourinary: Negative for dysuria, hematuria, flank pain and difficulty urinating.  Musculoskeletal: Negative for myalgias, back pain, joint swelling, arthralgias and gait problem.  Skin: Negative for color change, pallor, rash and wound.  Neurological: Negative for dizziness, tremors, weakness and light-headedness.  Hematological: Negative for adenopathy. Does not bruise/bleed easily.  Psychiatric/Behavioral: Negative for behavioral problems, confusion, sleep disturbance, dysphoric mood, decreased concentration and agitation.    Physical Exam   BP 113/77   Pulse 68   Temp 97.9 F (36.6 C) (Oral)   Ht 5\' 7"  (1.702 m)   Wt 144 lb (65.3 kg)   BMI 22.55 kg/m   Physical Exam  Constitutional: He is oriented to person, place, and time. He appears well-developed and well-nourished. No distress.  HENT:  Mouth/Throat: Oropharynx is clear and moist. No oropharyngeal exudate.  Cardiovascular: Normal rate, regular rhythm and normal heart sounds. Exam reveals no gallop and no friction rub.  No murmur heard.  Pulmonary/Chest: Effort normal and breath sounds normal. No respiratory distress. He has no wheezes.  Abdominal: Soft. Bowel sounds are normal. He exhibits no distension. There is no tenderness.  No HSM Lymphadenopathy:  He has no cervical adenopathy.  Neurological: He is alert and oriented to person, place, and time.  Skin: Skin is warm and dry. No rash noted. No erythema.  Psychiatric: He has a normal mood and affect. His behavior is normal.    Lab Results  Component Value Date   CD4TCELL 27 (L) 12/07/2016   Lab Results  Component Value Date   CD4TABS 530 12/07/2016   CD4TABS 540 06/08/2016   CD4TABS 440 11/02/2015   Lab Results  Component Value Date   HIV1RNAQUANT <20 NOT DETECTED 12/07/2016   Lab Results  Component Value Date   HEPBSAB POS (A) 11/02/2015   Lab Results  Component Value Date   LABRPR NON REAC 12/07/2016     CBC Lab Results  Component Value Date   WBC 6.0 12/07/2016   RBC 4.71 12/07/2016   HGB 15.0 12/07/2016   HCT 44.7 12/07/2016   PLT 111 (L) 12/07/2016   MCV 94.9 12/07/2016   MCH 31.8 12/07/2016   MCHC 33.6 12/07/2016   RDW 13.6 12/07/2016   LYMPHSABS 1,860 12/07/2016   MONOABS 720 12/07/2016   EOSABS 120 12/07/2016    BMET Lab Results  Component Value Date   NA 140 12/07/2016   K 4.4 12/07/2016   CL 102 12/07/2016   CO2 26 12/07/2016   GLUCOSE 71 12/07/2016   BUN 12 12/07/2016   CREATININE 0.73 12/07/2016   CALCIUM 9.4 12/07/2016   GFRNONAA >89 12/07/2016   GFRAA >89 12/07/2016      Assessment and Plan RUQ pain = will get ultrasound of liver to see if any abn , given that he is foreign born. Want to rule out liver abscess. He only has 3% eos on cbc differential where you would expect elevated if parasitic  hiv disease = well controlled. Continue with genvoya  Seizure = continue on current regimen  Hx of chest wall discomfort = thought to be MSK. Use prn ibuprofen

## 2017-06-07 ENCOUNTER — Encounter (INDEPENDENT_AMBULATORY_CARE_PROVIDER_SITE_OTHER): Payer: Self-pay

## 2017-06-07 ENCOUNTER — Encounter: Payer: Self-pay | Admitting: Internal Medicine

## 2017-06-07 ENCOUNTER — Ambulatory Visit: Payer: BLUE CROSS/BLUE SHIELD | Admitting: Internal Medicine

## 2017-06-07 ENCOUNTER — Ambulatory Visit: Payer: BLUE CROSS/BLUE SHIELD

## 2017-06-07 ENCOUNTER — Other Ambulatory Visit (HOSPITAL_COMMUNITY)
Admission: RE | Admit: 2017-06-07 | Discharge: 2017-06-07 | Disposition: A | Discharge status: Home / Self Care | Payer: BLUE CROSS/BLUE SHIELD | Source: Ambulatory Visit | Attending: Internal Medicine | Admitting: Internal Medicine

## 2017-06-07 VITALS — BP 109/71 | HR 75 | Temp 98.4°F | Ht 67.0 in | Wt 140.0 lb

## 2017-06-07 DIAGNOSIS — Z113 Encounter for screening for infections with a predominantly sexual mode of transmission: Secondary | ICD-10-CM | POA: Diagnosis not present

## 2017-06-07 LAB — COMPLETE METABOLIC PANEL WITH GFR
ALBUMIN: 4.4 g/dL (ref 3.6–5.1)
ALT: 13 U/L (ref 9–46)
AST: 15 U/L (ref 10–40)
Alkaline Phosphatase: 58 U/L (ref 40–115)
BUN: 12 mg/dL (ref 7–25)
CO2: 27 mmol/L (ref 20–32)
Calcium: 9.1 mg/dL (ref 8.6–10.3)
Chloride: 102 mmol/L (ref 98–110)
Creat: 0.77 mg/dL (ref 0.60–1.35)
GFR, Est African American: >89 mL/min (ref >=60)
GFR, Est Non African American: >89 mL/min (ref >=60)
GLUCOSE: 87 mg/dL (ref 65–99)
POTASSIUM: 4.2 mmol/L (ref 3.5–5.3)
Sodium: 140 mmol/L (ref 135–146)
Total Bilirubin: 0.3 mg/dL (ref 0.2–1.2)
Total Protein: 7.4 g/dL (ref 6.1–8.1)

## 2017-06-07 LAB — CBC WITH DIFFERENTIAL/PLATELET
Basophils Absolute: 54 cells/uL (ref 0–200)
Basophils Relative: 1 %
EOS PCT: 3 %
Eosinophils Absolute: 162 cells/uL (ref 15–500)
HCT: 45.1 % (ref 38.5–50.0)
HEMOGLOBIN: 15 g/dL (ref 13.2–17.1)
LYMPHS ABS: 1566 {cells}/uL (ref 850–3900)
Lymphocytes Relative: 29 %
MCH: 32 pg (ref 27.0–33.0)
MCHC: 33.3 g/dL (ref 32.0–36.0)
MCV: 96.2 fL (ref 80.0–100.0)
MPV: 13.1 fL — ABNORMAL HIGH (ref 7.5–12.5)
Monocytes Absolute: 648 cells/uL (ref 200–950)
Monocytes Relative: 12 %
NEUTROS ABS: 2970 {cells}/uL (ref 1500–7800)
NEUTROS PCT: 55 %
Platelets: 109 10*3/uL — ABNORMAL LOW (ref 140–400)
RBC: 4.69 MIL/uL (ref 4.20–5.80)
RDW: 13.4 % (ref 11.0–15.0)
WBC: 5.4 10*3/uL (ref 3.8–10.8)

## 2017-06-08 LAB — T-HELPER CELL (CD4) - (RCID CLINIC ONLY)
CD4 T CELL ABS: 510 /uL (ref 400–2700)
CD4 T CELL HELPER: 27 % — AB (ref 33–55)

## 2017-06-08 LAB — URINE CYTOLOGY ANCILLARY ONLY
CHLAMYDIA, DNA PROBE: NEGATIVE
NEISSERIA GONORRHEA: NEGATIVE

## 2017-06-11 LAB — HIV-1 RNA QUANT-NO REFLEX-BLD
HIV 1 RNA QUANT: 38 {copies}/mL — AB
HIV-1 RNA QUANT, LOG: 1.58 {Log_copies}/mL — AB

## 2017-07-10 ENCOUNTER — Ambulatory Visit: Payer: Self-pay | Admitting: Internal Medicine

## 2017-07-19 ENCOUNTER — Other Ambulatory Visit: Payer: Self-pay | Admitting: Internal Medicine

## 2017-07-19 DIAGNOSIS — B20 Human immunodeficiency virus [HIV] disease: Secondary | ICD-10-CM

## 2017-08-04 ENCOUNTER — Other Ambulatory Visit: Payer: Self-pay | Admitting: Internal Medicine

## 2017-08-04 DIAGNOSIS — G40909 Epilepsy, unspecified, not intractable, without status epilepticus: Secondary | ICD-10-CM

## 2017-08-06 ENCOUNTER — Encounter: Payer: Self-pay | Admitting: *Deleted

## 2017-08-06 MED ORDER — DIVALPROEX SODIUM ER 500 MG PO TB24: 1000.0000 mg | ORAL_TABLET | Freq: Every day | ORAL | 0 refills | Status: DC

## 2017-08-06 NOTE — Addendum Note (Signed)
Addended by: Jennet Maduro D on: 08/06/2017 09:39 AM   Modules accepted: Orders

## 2017-08-06 NOTE — Telephone Encounter (Signed)
Pharmacy needing 90-day rx for drug.

## 2017-08-07 ENCOUNTER — Other Ambulatory Visit: Payer: Self-pay | Admitting: *Deleted

## 2017-08-07 DIAGNOSIS — G40909 Epilepsy, unspecified, not intractable, without status epilepticus: Secondary | ICD-10-CM

## 2017-08-07 MED ORDER — DIVALPROEX SODIUM ER 500 MG PO TB24: 1000.0000 mg | ORAL_TABLET | Freq: Every day | ORAL | 0 refills | Status: DC

## 2017-10-25 ENCOUNTER — Other Ambulatory Visit: Payer: Self-pay | Admitting: Internal Medicine

## 2017-10-25 DIAGNOSIS — G40909 Epilepsy, unspecified, not intractable, without status epilepticus: Secondary | ICD-10-CM

## 2017-10-30 ENCOUNTER — Ambulatory Visit (INDEPENDENT_AMBULATORY_CARE_PROVIDER_SITE_OTHER): Payer: BLUE CROSS/BLUE SHIELD | Admitting: Internal Medicine

## 2017-10-30 ENCOUNTER — Encounter: Payer: Self-pay | Admitting: Internal Medicine

## 2017-10-30 VITALS — BP 115/68 | HR 73 | Temp 97.2°F | Ht 67.0 in | Wt 135.0 lb

## 2017-10-30 DIAGNOSIS — B20 Human immunodeficiency virus [HIV] disease: Secondary | ICD-10-CM | POA: Diagnosis not present

## 2017-10-30 DIAGNOSIS — L282 Other prurigo: Secondary | ICD-10-CM

## 2017-10-30 LAB — CBC WITH DIFFERENTIAL/PLATELET
Basophils Absolute: 37 {cells}/uL (ref 0–200)
Basophils Relative: 0.4 %
Eosinophils Absolute: 120 {cells}/uL (ref 15–500)
Eosinophils Relative: 1.3 %
HCT: 44.6 % (ref 38.5–50.0)
Hemoglobin: 15.3 g/dL (ref 13.2–17.1)
Lymphs Abs: 1205 {cells}/uL (ref 850–3900)
MCH: 31.7 pg (ref 27.0–33.0)
MCHC: 34.3 g/dL (ref 32.0–36.0)
MCV: 92.5 fL (ref 80.0–100.0)
MPV: 12.8 fL — ABNORMAL HIGH (ref 7.5–12.5)
Monocytes Relative: 5.9 %
Neutro Abs: 7296 {cells}/uL (ref 1500–7800)
Neutrophils Relative %: 79.3 %
Platelets: 115 Thousand/uL — ABNORMAL LOW (ref 140–400)
RBC: 4.82 Million/uL (ref 4.20–5.80)
RDW: 12.5 % (ref 11.0–15.0)
Total Lymphocyte: 13.1 %
WBC mixed population: 543 {cells}/uL (ref 200–950)
WBC: 9.2 Thousand/uL (ref 3.8–10.8)

## 2017-10-30 LAB — COMPLETE METABOLIC PANEL WITH GFR
AG Ratio: 1.4 (calc) (ref 1.0–2.5)
ALT: 13 U/L (ref 9–46)
AST: 14 U/L (ref 10–40)
Albumin: 4.4 g/dL (ref 3.6–5.1)
Alkaline phosphatase (APISO): 52 U/L (ref 40–115)
BUN: 16 mg/dL (ref 7–25)
CALCIUM: 9.3 mg/dL (ref 8.6–10.3)
CO2: 30 mmol/L (ref 20–32)
Chloride: 103 mmol/L (ref 98–110)
Creat: 0.76 mg/dL (ref 0.60–1.35)
GFR, EST NON AFRICAN AMERICAN: 110 mL/min/{1.73_m2} (ref >=60)
GFR, Est African American: 128 mL/min/{1.73_m2} (ref >=60)
Globulin: 3.2 g/dL (calc) (ref 1.9–3.7)
Glucose, Bld: 113 mg/dL — ABNORMAL HIGH (ref 65–99)
Potassium: 4.6 mmol/L (ref 3.5–5.3)
Sodium: 140 mmol/L (ref 135–146)
TOTAL PROTEIN: 7.6 g/dL (ref 6.1–8.1)
Total Bilirubin: 0.3 mg/dL (ref 0.2–1.2)

## 2017-10-30 MED ORDER — ELVITEG-COBIC-EMTRICIT-TENOFAF 150-150-200-10 MG PO TABS: 1.0000 | ORAL_TABLET | Freq: Every day | ORAL | 5 refills | Status: DC

## 2017-10-30 MED ORDER — MOMETASONE FUROATE 0.1 % EX SOLN: Freq: Every day | CUTANEOUS | 1 refills | Status: DC

## 2017-10-30 NOTE — Progress Notes (Signed)
RFV: follow up for hiv disease  Patient ID: Mitchell Leonard, male   DOB: Jul 25, 1971, 47 y.o.   MRN: [id number]  HPI Mitchell Leonard is a 47yo M with hiv disease and seizure disorder. Cd 4 count of 510/VL 38 in august on genvoya. Has upcoming deadline on renewal of his conditional green card coming up on feb 5th. He is worried that he has not received any paperwork. He still has ongoing rash to calf bilaterally, pruritic. Prior cream did not work.  Outpatient Encounter Medications as of 10/30/2017  Medication Sig  . divalproex (DEPAKOTE ER) 500 MG 24 hr tablet TAKE 2 TABLETS BY MOUTH EVERY DAY  . GENVOYA 150-150-200-10 MG TABS tablet TAKE 1 TABLET BY MOUTH DAILY WITH BREAKFAST.   No facility-administered encounter medications on file as of 10/30/2017.      Patient Active Problem List   Diagnosis Date Noted  . HIV disease (HCC) 10/21/2015  . Seizures (HCC) 10/21/2015  . GERD (gastroesophageal reflux disease) 10/21/2015     Health Maintenance Due  Topic Date Due  . INFLUENZA VACCINE  05/23/2017     Review of Systems Review of Systems  Constitutional: Negative for fever, chills, diaphoresis, activity change, appetite change, fatigue and unexpected weight change.  HENT: Negative for congestion, sore throat, rhinorrhea, sneezing, trouble swallowing and sinus pressure.  Eyes: Negative for photophobia and visual disturbance.  Respiratory: Negative for cough, chest tightness, shortness of breath, wheezing and stridor.  Cardiovascular: Negative for chest pain, palpitations and leg swelling.  Gastrointestinal: Negative for nausea, vomiting, abdominal pain, diarrhea, constipation, blood in stool, abdominal distention and anal bleeding.  Genitourinary: Negative for dysuria, hematuria, flank pain and difficulty urinating.  Musculoskeletal: Negative for myalgias, back pain, joint swelling, arthralgias and gait problem.  Skin: Negative for color change, pallor, rash and wound.  Neurological: Negative  for dizziness, tremors, weakness and light-headedness.  Hematological: Negative for adenopathy. Does not bruise/bleed easily.  Psychiatric/Behavioral: Negative for behavioral problems, confusion, sleep disturbance, dysphoric mood, decreased concentration and agitation.    Physical Exam   BP 115/68   Pulse 73   Temp (!) 97.2 F (36.2 C) (Oral)   Ht 5\' 7"  (1.702 m)   Wt 135 lb (61.2 kg)   BMI 21.14 kg/m   Physical Exam  Constitutional: He is oriented to person, place, and time. He appears well-developed and well-nourished. No distress.  HENT:  Mouth/Throat: Oropharynx is clear and moist. No oropharyngeal exudate.  Cardiovascular: Normal rate, regular rhythm and normal heart sounds. Exam reveals no gallop and no friction rub.  No murmur heard.  Pulmonary/Chest: Effort normal and breath sounds normal. No respiratory distress. He has no wheezes.  Abdominal: Soft. Bowel sounds are normal. He exhibits no distension. There is no tenderness.  Lymphadenopathy:  He has no cervical adenopathy.  Neurological: He is alert and oriented to person, place, and time.  Skin: Pruritic patch macule to calf bilaterally Lab Results  Component Value Date   CD4TCELL 27 (L) 06/07/2017   Lab Results  Component Value Date   CD4TABS 510 06/07/2017   CD4TABS 530 12/07/2016   CD4TABS 540 06/08/2016   Lab Results  Component Value Date   HIV1RNAQUANT 38 (H) 06/07/2017   Lab Results  Component Value Date   HEPBSAB POS (A) 11/02/2015   Lab Results  Component Value Date   LABRPR NON REAC 12/07/2016    CBC Lab Results  Component Value Date   WBC 5.4 06/07/2017   RBC 4.69 06/07/2017   HGB 15.0 06/07/2017  HCT 45.1 06/07/2017   PLT 109 (L) 06/07/2017   MCV 96.2 06/07/2017   MCH 32.0 06/07/2017   MCHC 33.3 06/07/2017   RDW 13.4 06/07/2017   LYMPHSABS 1,566 06/07/2017   MONOABS 648 06/07/2017   EOSABS 162 06/07/2017    BMET Lab Results  Component Value Date   NA 140 06/07/2017   K 4.2  06/07/2017   CL 102 06/07/2017   CO2 27 06/07/2017   GLUCOSE 87 06/07/2017   BUN 12 06/07/2017   CREATININE 0.77 06/07/2017   CALCIUM 9.1 06/07/2017   GFRNONAA >89 06/07/2017   GFRAA >89 06/07/2017      Assessment and Plan   Dermatitis = will give higher potency steroid to use x 2 wk to see if improvement. Does not appear cellulitic to suggest need for abtx  hiv disease = will check lab. Appears well controlled. Continue on current regimen  Health maintenance = utd on vaccines

## 2017-10-30 NOTE — Patient Instructions (Signed)
If you have any issues with your insurance, please come to clinic to do ADAP paperwork to get coverage for meds and labs

## 2017-10-31 LAB — T-HELPER CELL (CD4) - (RCID CLINIC ONLY)
CD4 T CELL HELPER: 27 % — AB (ref 33–55)
CD4 T Cell Abs: 390 /uL — ABNORMAL LOW (ref 400–2700)

## 2017-11-01 LAB — HIV-1 RNA QUANT-NO REFLEX-BLD
HIV 1 RNA Quant: <20 copies/mL — AB
HIV-1 RNA QUANT, LOG: DETECTED {Log_copies}/mL — AB

## 2018-01-04 ENCOUNTER — Other Ambulatory Visit: Payer: Self-pay | Admitting: Internal Medicine

## 2018-01-04 DIAGNOSIS — G40909 Epilepsy, unspecified, not intractable, without status epilepticus: Secondary | ICD-10-CM

## 2018-01-10 ENCOUNTER — Telehealth: Payer: Self-pay | Admitting: *Deleted

## 2018-01-10 DIAGNOSIS — G40909 Epilepsy, unspecified, not intractable, without status epilepticus: Secondary | ICD-10-CM

## 2018-01-10 MED ORDER — DIVALPROEX SODIUM ER 500 MG PO TB24: 1000.0000 mg | ORAL_TABLET | Freq: Every day | ORAL | 1 refills | Status: DC

## 2018-01-10 NOTE — Telephone Encounter (Signed)
Pharmacy claims they did not receive the electronic reorder of depakote. RN gave verbal order for refills. Andree CossHowell, Annalynn Centanni M, RN

## 2018-03-05 ENCOUNTER — Encounter: Payer: Self-pay | Admitting: Internal Medicine

## 2018-03-05 ENCOUNTER — Ambulatory Visit: Payer: BLUE CROSS/BLUE SHIELD | Admitting: Internal Medicine

## 2018-03-05 VITALS — BP 113/73 | HR 73 | Temp 97.7°F | Wt 132.0 lb

## 2018-03-05 DIAGNOSIS — Z23 Encounter for immunization: Secondary | ICD-10-CM | POA: Diagnosis not present

## 2018-03-05 DIAGNOSIS — B20 Human immunodeficiency virus [HIV] disease: Secondary | ICD-10-CM | POA: Diagnosis not present

## 2018-03-05 DIAGNOSIS — G40909 Epilepsy, unspecified, not intractable, without status epilepticus: Secondary | ICD-10-CM | POA: Diagnosis not present

## 2018-03-05 MED ORDER — ELVITEG-COBIC-EMTRICIT-TENOFAF 150-150-200-10 MG PO TABS: 1.0000 | ORAL_TABLET | Freq: Every day | ORAL | 5 refills | Status: DC

## 2018-03-05 MED ORDER — MOMETASONE FUROATE 0.1 % EX SOLN: Freq: Every day | CUTANEOUS | 1 refills | Status: DC

## 2018-03-05 MED ORDER — PNEUMOCOCCAL 13-VAL CONJ VACC IM SUSP
0.5000 mL | INTRAMUSCULAR | Status: AC
  Administered 2018-03-05: 0.5 mL via INTRAMUSCULAR

## 2018-03-05 NOTE — Progress Notes (Signed)
RFV: follow up on hiv disease  Patient ID: Mitchell Leonard, male   DOB: [birth year], 47 y.o.   MRN: [id number]  HPI Mitchell Leonard is a 47yo M with hiv disease, CD 4 count of 390/VL<20 on genvoya. Also has sz disorder currently well controlled on depakote. He reports being in good health since we last saw him. Needs refill on steroid cream that he uses for dermatits to legs.  Not sexually active  Outpatient Encounter Medications as of 03/05/2018  Medication Sig  . divalproex (DEPAKOTE ER) 500 MG 24 hr tablet Take 2 tablets (1,000 mg total) by mouth daily.  Marland Kitchen elvitegravir-cobicistat-emtricitabine-tenofovir (GENVOYA) 150-150-200-10 MG TABS tablet Take 1 tablet by mouth daily with breakfast.  . mometasone (ELOCON) 0.1 % lotion Apply topically daily. To affected area to your legs   No facility-administered encounter medications on file as of 03/05/2018.      Patient Active Problem List   Diagnosis Date Noted  . HIV disease (HCC) 10/21/2015  . Seizures (HCC) 10/21/2015  . GERD (gastroesophageal reflux disease) 10/21/2015     There are no preventive care reminders to display for this patient.   Review of Systems 12 point ros is otherwise negative Physical Exam   BP 113/73   Pulse 73   Temp 97.7 F (36.5 C) (Oral)   Wt 132 lb (59.9 kg)   BMI 20.67 kg/m   Physical Exam  Constitutional: He is oriented to person, place, and time. He appears well-developed and well-nourished. No distress.  HENT:  Mouth/Throat: Oropharynx is clear and moist. No oropharyngeal exudate.  Cardiovascular: Normal rate, regular rhythm and normal heart sounds. Exam reveals no gallop and no friction rub.  No murmur heard.  Pulmonary/Chest: Effort normal and breath sounds normal. No respiratory distress. He has no wheezes.  Abdominal: Soft. Bowel sounds are normal. He exhibits no distension. There is no tenderness.  Lymphadenopathy:  He has no cervical adenopathy.  Neurological: He is alert and oriented to person,  place, and time.  Skin: Skin is warm and dry. No rash noted. No erythema.  Psychiatric: He has a normal mood and affect. His behavior is normal.    Lab Results  Component Value Date   CD4TCELL 27 (L) 10/30/2017   Lab Results  Component Value Date   CD4TABS 390 (L) 10/30/2017   CD4TABS 510 06/07/2017   CD4TABS 530 12/07/2016   Lab Results  Component Value Date   HIV1RNAQUANT <20 DETECTED (A) 10/30/2017   Lab Results  Component Value Date   HEPBSAB POS (A) 11/02/2015   Lab Results  Component Value Date   LABRPR NON REAC 12/07/2016    CBC Lab Results  Component Value Date   WBC 9.2 10/30/2017   RBC 4.82 10/30/2017   HGB 15.3 10/30/2017   HCT 44.6 10/30/2017   PLT 115 (L) 10/30/2017   MCV 92.5 10/30/2017   MCH 31.7 10/30/2017   MCHC 34.3 10/30/2017   RDW 12.5 10/30/2017   LYMPHSABS 1,205 10/30/2017   MONOABS 648 06/07/2017   EOSABS 120 10/30/2017    BMET Lab Results  Component Value Date   NA 140 10/30/2017   K 4.6 10/30/2017   CL 103 10/30/2017   CO2 30 10/30/2017   GLUCOSE 113 (H) 10/30/2017   BUN 16 10/30/2017   CREATININE 0.76 10/30/2017   CALCIUM 9.3 10/30/2017   GFRNONAA 110 10/30/2017   GFRAA 128 10/30/2017      Assessment and Plan  hiv disease = doing well with adherence. Will check labs today.gave  refills for 6 months  Health maintenance = will give prevnar 13 today  Dermatitis = refill on steroid ointment  Hx of seizure disorder = continue on anti-epileptci

## 2018-03-06 LAB — RPR: RPR: NONREACTIVE

## 2018-03-06 LAB — CBC WITH DIFFERENTIAL/PLATELET
BASOS ABS: 28 {cells}/uL (ref 0–200)
Basophils Relative: 0.5 %
EOS ABS: 179 {cells}/uL (ref 15–500)
Eosinophils Relative: 3.2 %
HCT: 43.5 % (ref 38.5–50.0)
Hemoglobin: 14.9 g/dL (ref 13.2–17.1)
LYMPHS ABS: 1490 {cells}/uL (ref 850–3900)
MCH: 31.6 pg (ref 27.0–33.0)
MCHC: 34.3 g/dL (ref 32.0–36.0)
MCV: 92.4 fL (ref 80.0–100.0)
MONOS PCT: 9.1 %
MPV: 14.1 fL — ABNORMAL HIGH (ref 7.5–12.5)
NEUTROS PCT: 60.6 %
Neutro Abs: 3394 cells/uL (ref 1500–7800)
Platelets: 90 10*3/uL — ABNORMAL LOW (ref 140–400)
RBC: 4.71 10*6/uL (ref 4.20–5.80)
RDW: 12.1 % (ref 11.0–15.0)
Total Lymphocyte: 26.6 %
WBC mixed population: 510 cells/uL (ref 200–950)
WBC: 5.6 10*3/uL (ref 3.8–10.8)

## 2018-03-06 LAB — COMPLETE METABOLIC PANEL WITH GFR
AG RATIO: 1.4 (calc) (ref 1.0–2.5)
ALKALINE PHOSPHATASE (APISO): 69 U/L (ref 40–115)
ALT: 24 U/L (ref 9–46)
AST: 21 U/L (ref 10–40)
Albumin: 4.6 g/dL (ref 3.6–5.1)
BILIRUBIN TOTAL: 0.3 mg/dL (ref 0.2–1.2)
BUN: 13 mg/dL (ref 7–25)
CHLORIDE: 103 mmol/L (ref 98–110)
CO2: 28 mmol/L (ref 20–32)
Calcium: 9.3 mg/dL (ref 8.6–10.3)
Creat: 0.77 mg/dL (ref 0.60–1.35)
GFR, EST AFRICAN AMERICAN: 127 mL/min/{1.73_m2} (ref >=60)
GFR, Est Non African American: 110 mL/min/{1.73_m2} (ref >=60)
GLUCOSE: 87 mg/dL (ref 65–99)
Globulin: 3.2 g/dL (calc) (ref 1.9–3.7)
POTASSIUM: 4.7 mmol/L (ref 3.5–5.3)
SODIUM: 140 mmol/L (ref 135–146)
Total Protein: 7.8 g/dL (ref 6.1–8.1)

## 2018-03-06 LAB — T-HELPER CELL (CD4) - (RCID CLINIC ONLY)
CD4 T CELL HELPER: 27 % — AB (ref 33–55)
CD4 T Cell Abs: 410 /uL (ref 400–2700)

## 2018-03-07 LAB — HIV-1 RNA QUANT-NO REFLEX-BLD
HIV 1 RNA QUANT: 44 {copies}/mL — AB
HIV-1 RNA QUANT, LOG: 1.64 {Log_copies}/mL — AB

## 2018-03-26 DIAGNOSIS — Z0000 Encounter for general adult medical examination without abnormal findings: Secondary | ICD-10-CM | POA: Diagnosis not present

## 2018-03-26 DIAGNOSIS — Z113 Encounter for screening for infections with a predominantly sexual mode of transmission: Secondary | ICD-10-CM | POA: Diagnosis not present

## 2018-08-10 ENCOUNTER — Other Ambulatory Visit: Payer: Self-pay | Admitting: Internal Medicine

## 2018-08-10 DIAGNOSIS — G40909 Epilepsy, unspecified, not intractable, without status epilepticus: Secondary | ICD-10-CM

## 2018-09-04 ENCOUNTER — Ambulatory Visit: Payer: BLUE CROSS/BLUE SHIELD | Admitting: Internal Medicine

## 2018-10-07 ENCOUNTER — Other Ambulatory Visit: Payer: Self-pay | Admitting: Behavioral Health

## 2018-10-07 DIAGNOSIS — G40909 Epilepsy, unspecified, not intractable, without status epilepticus: Secondary | ICD-10-CM

## 2018-10-07 DIAGNOSIS — B20 Human immunodeficiency virus [HIV] disease: Secondary | ICD-10-CM

## 2018-10-07 MED ORDER — ELVITEG-COBIC-EMTRICIT-TENOFAF 150-150-200-10 MG PO TABS: 1.0000 | ORAL_TABLET | Freq: Every day | ORAL | 0 refills | Status: DC

## 2018-10-07 MED ORDER — DIVALPROEX SODIUM ER 500 MG PO TB24: 1000.0000 mg | ORAL_TABLET | Freq: Every day | ORAL | 0 refills | Status: DC

## 2018-10-09 ENCOUNTER — Other Ambulatory Visit: Payer: BLUE CROSS/BLUE SHIELD

## 2018-10-09 ENCOUNTER — Other Ambulatory Visit: Payer: Self-pay | Admitting: Behavioral Health

## 2018-10-09 DIAGNOSIS — Z113 Encounter for screening for infections with a predominantly sexual mode of transmission: Secondary | ICD-10-CM

## 2018-10-09 DIAGNOSIS — Z79899 Other long term (current) drug therapy: Secondary | ICD-10-CM

## 2018-10-09 DIAGNOSIS — B20 Human immunodeficiency virus [HIV] disease: Secondary | ICD-10-CM

## 2018-11-06 ENCOUNTER — Ambulatory Visit: Payer: BLUE CROSS/BLUE SHIELD | Admitting: Internal Medicine

## 2018-11-06 VITALS — BP 99/62 | HR 65 | Temp 97.9°F | Wt 131.0 lb

## 2018-11-06 DIAGNOSIS — G8929 Other chronic pain: Secondary | ICD-10-CM

## 2018-11-06 DIAGNOSIS — B20 Human immunodeficiency virus [HIV] disease: Secondary | ICD-10-CM | POA: Diagnosis not present

## 2018-11-06 DIAGNOSIS — R1011 Right upper quadrant pain: Secondary | ICD-10-CM

## 2018-11-06 MED ORDER — METHOCARBAMOL 500 MG PO TABS: 500.0000 mg | ORAL_TABLET | Freq: Three times a day (TID) | ORAL | 0 refills | Status: DC | PRN

## 2018-11-06 NOTE — Patient Instructions (Signed)
For back pain, can use ibuprofen 200mg  ( take 2 tabs) plus tylenol 500mg  together. --- can do this combo once to twice a day.  Robaxin can take for muscle spasm

## 2018-11-07 LAB — T-HELPER CELL (CD4) - (RCID CLINIC ONLY)
CD4 % Helper T Cell: 32 % — ABNORMAL LOW (ref 33–55)
CD4 T Cell Abs: 580 /uL (ref 400–2700)

## 2018-11-09 NOTE — Progress Notes (Signed)
RFV: follow up for hiv disease  Patient ID: Mitchell Leonard, male   DOB: 02/14/1971, 48 y.o.   MRN: [id number]  HPI Mitchell Leonard is a 48yo M who has hiv disease and hx of seizure disorder, taking genvoya daily. His CD 4 count of 510/VL<20 on may 2019. Doing well with adherence. He notices on occasion having RUQ pain not associated with eating. Unable to tell me what exacerbates it or relieves it. "nagging" sensation  Outpatient Encounter Medications as of 11/06/2018  Medication Sig  . divalproex (DEPAKOTE ER) 500 MG 24 hr tablet Take 2 tablets (1,000 mg total) by mouth daily.  Marland Kitchen. elvitegravir-cobicistat-emtricitabine-tenofovir (GENVOYA) 150-150-200-10 MG TABS tablet Take 1 tablet by mouth daily with breakfast.  . mometasone (ELOCON) 0.1 % lotion Apply topically daily. To affected area to your legs  . methocarbamol (ROBAXIN) 500 MG tablet Take 1 tablet (500 mg total) by mouth every 8 (eight) hours as needed for muscle spasms.   No facility-administered encounter medications on file as of 11/06/2018.      Patient Active Problem List   Diagnosis Date Noted  . HIV disease (HCC) 10/21/2015  . Seizures (HCC) 10/21/2015  . GERD (gastroesophageal reflux disease) 10/21/2015     Health Maintenance Due  Topic Date Due  . INFLUENZA VACCINE  05/23/2018    Social History   Tobacco Use  . Smoking status: Never Smoker  . Smokeless tobacco: Never Used  Substance Use Topics  . Alcohol use: No    Alcohol/week: 0.0 standard drinks  . Drug use: No   Review of Systems 12 point ros except what is mentioned above Physical Exam   BP 99/62   Pulse 65   Temp 97.9 F (36.6 C) (Oral)   Wt 131 lb (59.4 kg)   BMI 20.52 kg/m   Physical Exam  Constitutional: He is oriented to person, place, and time. He appears well-developed and well-nourished. No distress.  HENT:  Mouth/Throat: Oropharynx is clear and moist. No oropharyngeal exudate.  Cardiovascular: Normal rate, regular rhythm and normal heart  sounds. Exam reveals no gallop and no friction rub.  No murmur heard.  Pulmonary/Chest: Effort normal and breath sounds normal. No respiratory distress. He has no wheezes.  Abdominal: Soft. Bowel sounds are normal. He exhibits no distension. There is no tenderness.  Lymphadenopathy:  He has no cervical adenopathy.  Neurological: He is alert and oriented to person, place, and time.  Skin: Skin is warm and dry. No rash noted. No erythema.  Psychiatric: He has a normal mood and affect. His behavior is normal.    Lab Results  Component Value Date   CD4TCELL 32 (L) 11/06/2018   Lab Results  Component Value Date   CD4TABS 580 11/06/2018   CD4TABS 410 03/05/2018   CD4TABS 390 (L) 10/30/2017   Lab Results  Component Value Date   HIV1RNAQUANT <20 NOT DETECTED 11/06/2018   Lab Results  Component Value Date   HEPBSAB POS (A) 11/02/2015   Lab Results  Component Value Date   LABRPR NON-REACTIVE 11/06/2018    CBC Lab Results  Component Value Date   WBC 4.8 11/06/2018   RBC 4.43 11/06/2018   HGB 14.5 11/06/2018   HCT 41.9 11/06/2018   PLT 129 (L) 11/06/2018   MCV 94.6 11/06/2018   MCH 32.7 11/06/2018   MCHC 34.6 11/06/2018   RDW 12.5 11/06/2018   LYMPHSABS 1,642 11/06/2018   MONOABS 648 06/07/2017   EOSABS 120 11/06/2018    BMET Lab Results  Component Value Date  NA 139 11/06/2018   K 4.7 11/06/2018   CL 103 11/06/2018   CO2 31 11/06/2018   GLUCOSE 93 11/06/2018   BUN 13 11/06/2018   CREATININE 0.82 11/06/2018   CALCIUM 9.7 11/06/2018   GFRNONAA 106 11/06/2018   GFRAA 123 11/06/2018      Assessment and Plan  hiv disease = plan on continue on current regimen. Will check labs  ruq pain = will check cmp, liver ultrasound and see if ab to e.hystolitica - which can cause liver abscess [place]  Health maintenance = up todate

## 2018-11-12 ENCOUNTER — Ambulatory Visit
Admission: RE | Admit: 2018-11-12 | Discharge: 2018-11-12 | Disposition: A | Discharge status: Home / Self Care | Payer: BLUE CROSS/BLUE SHIELD | Source: Ambulatory Visit | Attending: Internal Medicine | Admitting: Internal Medicine

## 2018-11-12 DIAGNOSIS — R1011 Right upper quadrant pain: Principal | ICD-10-CM

## 2018-11-12 DIAGNOSIS — G8929 Other chronic pain: Secondary | ICD-10-CM

## 2018-11-13 LAB — CBC WITH DIFFERENTIAL/PLATELET
ABSOLUTE MONOCYTES: 499 {cells}/uL (ref 200–950)
Basophils Absolute: 58 cells/uL (ref 0–200)
Basophils Relative: 1.2 %
Eosinophils Absolute: 120 cells/uL (ref 15–500)
Eosinophils Relative: 2.5 %
HCT: 41.9 % (ref 38.5–50.0)
Hemoglobin: 14.5 g/dL (ref 13.2–17.1)
Lymphs Abs: 1642 cells/uL (ref 850–3900)
MCH: 32.7 pg (ref 27.0–33.0)
MCHC: 34.6 g/dL (ref 32.0–36.0)
MCV: 94.6 fL (ref 80.0–100.0)
MPV: 12.8 fL — ABNORMAL HIGH (ref 7.5–12.5)
Monocytes Relative: 10.4 %
Neutro Abs: 2482 cells/uL (ref 1500–7800)
Neutrophils Relative %: 51.7 %
Platelets: 129 10*3/uL — ABNORMAL LOW (ref 140–400)
RBC: 4.43 10*6/uL (ref 4.20–5.80)
RDW: 12.5 % (ref 11.0–15.0)
Total Lymphocyte: 34.2 %
WBC: 4.8 10*3/uL (ref 3.8–10.8)

## 2018-11-13 LAB — COMPLETE METABOLIC PANEL WITH GFR
AG RATIO: 1.5 (calc) (ref 1.0–2.5)
ALT: 17 U/L (ref 9–46)
AST: 14 U/L (ref 10–40)
Albumin: 4.5 g/dL (ref 3.6–5.1)
Alkaline phosphatase (APISO): 61 U/L (ref 40–115)
BUN: 13 mg/dL (ref 7–25)
CO2: 31 mmol/L (ref 20–32)
Calcium: 9.7 mg/dL (ref 8.6–10.3)
Chloride: 103 mmol/L (ref 98–110)
Creat: 0.82 mg/dL (ref 0.60–1.35)
GFR, Est African American: 123 mL/min/{1.73_m2} (ref >=60)
GFR, Est Non African American: 106 mL/min/{1.73_m2} (ref >=60)
Globulin: 3.1 g/dL (calc) (ref 1.9–3.7)
Glucose, Bld: 93 mg/dL (ref 65–99)
POTASSIUM: 4.7 mmol/L (ref 3.5–5.3)
Sodium: 139 mmol/L (ref 135–146)
Total Bilirubin: 0.2 mg/dL (ref 0.2–1.2)
Total Protein: 7.6 g/dL (ref 6.1–8.1)

## 2018-11-13 LAB — RPR: RPR Ser Ql: NONREACTIVE

## 2018-11-13 LAB — HIV-1 RNA QUANT-NO REFLEX-BLD
HIV 1 RNA Quant: <20 copies/mL
HIV-1 RNA Quant, Log: <1.3 Log copies/mL

## 2018-11-13 LAB — E. HISTOLYTICA ANTIBODY (AMOEBA AB): E HISTOLYTICA AB: NEGATIVE

## 2019-01-08 ENCOUNTER — Other Ambulatory Visit: Payer: Self-pay | Admitting: Internal Medicine

## 2019-01-08 DIAGNOSIS — G40909 Epilepsy, unspecified, not intractable, without status epilepticus: Secondary | ICD-10-CM

## 2019-02-06 ENCOUNTER — Ambulatory Visit: Payer: BLUE CROSS/BLUE SHIELD | Admitting: Family

## 2019-02-13 ENCOUNTER — Other Ambulatory Visit: Payer: Self-pay

## 2019-02-13 ENCOUNTER — Ambulatory Visit: Payer: BLUE CROSS/BLUE SHIELD | Admitting: Family

## 2019-02-13 ENCOUNTER — Encounter: Payer: Self-pay | Admitting: Family

## 2019-02-13 VITALS — BP 124/81 | HR 71 | Temp 98.2°F | Wt 127.0 lb

## 2019-02-13 DIAGNOSIS — R569 Unspecified convulsions: Secondary | ICD-10-CM | POA: Diagnosis not present

## 2019-02-13 DIAGNOSIS — Z0000 Encounter for general adult medical examination without abnormal findings: Secondary | ICD-10-CM | POA: Insufficient documentation

## 2019-02-13 DIAGNOSIS — R634 Abnormal weight loss: Secondary | ICD-10-CM | POA: Insufficient documentation

## 2019-02-13 DIAGNOSIS — B20 Human immunodeficiency virus [HIV] disease: Secondary | ICD-10-CM | POA: Diagnosis not present

## 2019-02-13 DIAGNOSIS — Z113 Encounter for screening for infections with a predominantly sexual mode of transmission: Secondary | ICD-10-CM | POA: Diagnosis not present

## 2019-02-13 MED ORDER — ELVITEG-COBIC-EMTRICIT-TENOFAF 150-150-200-10 MG PO TABS: 1.0000 | ORAL_TABLET | Freq: Every day | ORAL | 0 refills | Status: DC

## 2019-02-13 MED ORDER — MOMETASONE FUROATE 0.1 % EX SOLN: Freq: Every day | CUTANEOUS | 1 refills | Status: DC

## 2019-02-13 NOTE — Assessment & Plan Note (Addendum)
Mitchell Leonard has had progressive weight loss with max weight recorded at 151 in October 2017 now down today to 127.  BMI is currently 19 which is normal.  He has not been trying to lose weight.  Denies fevers, chills, or night sweats.  Discussed that this is likely related to his nutritional intake as he primarily takes and fruit juice.  Have encouraged him to increase caloric density with fat and protein.  May consider referral for cancer screenings although has minimal risk factors at the present time.  Continue to monitor weight.

## 2019-02-13 NOTE — Progress Notes (Signed)
Subjective:    Patient ID: Mitchell Leonard, male    DOB: Sep 25, 1971, 48 y.o.   MRN: [id number]  Chief Complaint  Patient presents with  . HIV Positive/AIDS     HPI:  Mitchell Leonard is a 48 y.o. male with seizure disorder and HIV disease who was last seen in the office on 11/06/2018 with good adherence and tolerance to his ART regimen of Genvoya. Viral load at the time was undetectable and CD4 count of 580. RPR was non-reactive with renal function, liver function and electrolytes being normal. Healthcare maintenance due includes second dose of Menveo.  Mitchell Leonard continues to take his Genvoya as prescribed with no adverse side effects or missed doses.  Feels well today although is continuing to have right flank pain similar to previously described during his last office visit.  Pain generally waxes and wanes and has been going on since high school.  No trauma or injury that he can recall.  He has also lost weight progressively without trying.  Describes his nutritional intake is "not eating on time".  Generally eats 1-2 meals per day consisting primarily of fruit and fruit juice.  Appetite is okay and does eat but is concerned about timing of eating.  Denies abdominal pain, nausea, vomiting, diarrhea, or constipation.  Mitchell Leonard remains covered through [facility] and has no problems obtaining his medications from [facility].  He is not currently sexually active at present.  Denies recreational or illicit drug use.  No feelings of being down, depressed, or hopeless in the last 2 weeks.  He has had no seizures in a long time.   Wt Readings from Last 3 Encounters:  02/13/19 127 lb (57.6 kg)  11/06/18 131 lb (59.4 kg)  03/05/18 132 lb (59.9 kg)     No Known Allergies    Outpatient Medications Prior to Visit  Medication Sig Dispense Refill  . divalproex (DEPAKOTE ER) 500 MG 24 hr tablet TAKE 2 TABLETS BY MOUTH EVERY DAY 180 tablet 0  . methocarbamol (ROBAXIN) 500 MG  tablet Take 1 tablet (500 mg total) by mouth every 8 (eight) hours as needed for muscle spasms. 30 tablet 0  . elvitegravir-cobicistat-emtricitabine-tenofovir (GENVOYA) 150-150-200-10 MG TABS tablet Take 1 tablet by mouth daily with breakfast. 90 tablet 0  . mometasone (ELOCON) 0.1 % lotion Apply topically daily. To affected area to your legs 60 mL 1   No facility-administered medications prior to visit.      Past Medical History:  Diagnosis Date  . HIV (human immunodeficiency virus infection) (HCC)   . Seizure disorder, secondary (HCC)     No past surgical history on file.    Review of Systems  Constitutional: Negative for appetite change, chills, fatigue, fever and unexpected weight change.  Eyes: Negative for visual disturbance.  Respiratory: Negative for cough, chest tightness, shortness of breath and wheezing.   Cardiovascular: Negative for chest pain and leg swelling.  Gastrointestinal: Negative for abdominal pain, constipation, diarrhea, nausea and vomiting.  Genitourinary: Negative for dysuria, flank pain, frequency, genital sores, hematuria and urgency.  Skin: Negative for rash.  Allergic/Immunologic: Negative for immunocompromised state.  Neurological: Negative for dizziness and headaches.      Objective:    BP 124/81   Pulse 71   Temp 98.2 F (36.8 C)   Wt 127 lb (57.6 kg)   BMI 19.89 kg/m  Nursing note and vital signs reviewed.  Physical Exam Constitutional:      General: He is not in  acute distress.    Appearance: He is well-developed.     Comments: Pleasant; seated in the chair.  Eyes:     Conjunctiva/sclera: Conjunctivae normal.  Neck:     Musculoskeletal: Neck supple.  Cardiovascular:     Rate and Rhythm: Normal rate and regular rhythm.     Heart sounds: Normal heart sounds. No murmur. No friction rub. No gallop.   Pulmonary:     Effort: Pulmonary effort is normal. No respiratory distress.     Breath sounds: Normal breath sounds. No wheezing or  rales.  Chest:     Chest wall: No tenderness.  Abdominal:     General: Bowel sounds are normal.     Palpations: Abdomen is soft.     Tenderness: There is no abdominal tenderness.  Lymphadenopathy:     Cervical: No cervical adenopathy.  Skin:    General: Skin is warm and dry.     Findings: No rash.  Neurological:     Mental Status: He is alert and oriented to person, place, and time.  Psychiatric:        Behavior: Behavior normal.        Thought Content: Thought content normal.        Judgment: Judgment normal.        Assessment & Plan:   Problem List Items Addressed This Visit      Other   HIV disease (HCC) - Primary    Mitchell Leonard has well-controlled HIV disease with good adherence and tolerance to his ART regimen of Genvoya.  No signs/symptoms of opportunistic infection or progressive HIV disease at present.  He has no problems obtaining his medication.  Continue current dose of Genvoya.  Plan for follow-up in 3 months or sooner if needed with lab work on the same day as appointment.      Relevant Medications   elvitegravir-cobicistat-emtricitabine-tenofovir (GENVOYA) 150-150-200-10 MG TABS tablet   Other Relevant Orders   CBC   HIV-1 RNA quant-no reflex-bld   T-helper cell (CD4)- ([facility] clinic only)   Seizures (HCC)    No recent seizures and appears adequately controlled with current regimen of Depakote with good adherence and tolerance.  Continue current dose of Depakote      Weight loss    Mitchell Leonard has had progressive weight loss with max weight recorded at 151 in October 2017 now down today to 127.  BMI is currently 19 which is normal.  He has not been trying to lose weight.  Denies fevers, chills, or night sweats.  Discussed that this is likely related to his nutritional intake as he primarily takes and fruit juice.  Have encouraged him to increase caloric density with fat and protein.  May consider referral for cancer screenings although has minimal risk factors  at the present time.  Continue to monitor weight.      Healthcare maintenance     Declines vaccinations today.  Discussed importance of safe sexual practice to reduce risk of acquisition and transmission of STI.       Other Visit Diagnoses    Screening for STDs (sexually transmitted diseases)       Relevant Orders   RPR       I am having Mitchell Leonard maintain his methocarbamol, divalproex, elvitegravir-cobicistat-emtricitabine-tenofovir, and mometasone.   Meds ordered this encounter  Medications  . elvitegravir-cobicistat-emtricitabine-tenofovir (GENVOYA) 150-150-200-10 MG TABS tablet    Sig: Take 1 tablet by mouth daily with breakfast.    Dispense:  90 tablet  Refill:  0    Order Specific Question:   Supervising Provider    Answer:   Judyann Munson [[id number]]  . mometasone (ELOCON) 0.1 % lotion    Sig: Apply topically daily. To affected area to your legs    Dispense:  60 mL    Refill:  1    Order Specific Question:   Supervising Provider    Answer:   Judyann Munson [[id number]]     Follow-up: Return in about 3 months (around 05/15/2019), or if symptoms worsen or fail to improve.   Mitchell Eke, MSN, FNP-C Nurse Practitioner [facility] for Infectious Disease [facility] Main number: [phone]

## 2019-02-13 NOTE — Assessment & Plan Note (Signed)
   Declines vaccinations today.  Discussed importance of safe sexual practice to reduce risk of acquisition and transmission of STI.

## 2019-02-13 NOTE — Assessment & Plan Note (Signed)
Mitchell Leonard has well-controlled HIV disease with good adherence and tolerance to his ART regimen of Genvoya.  No signs/symptoms of opportunistic infection or progressive HIV disease at present.  He has no problems obtaining his medication.  Continue current dose of Genvoya.  Plan for follow-up in 3 months or sooner if needed with lab work on the same day as appointment.

## 2019-02-13 NOTE — Patient Instructions (Addendum)
Nice to meet you.   We will continue your Genvoya as prescribed.  Refills of your medication have been sent to your pharmacy.  We will plan to see you back in 3 months or sooner if needed with lab work at the appointment.

## 2019-02-13 NOTE — Assessment & Plan Note (Signed)
No recent seizures and appears adequately controlled with current regimen of Depakote with good adherence and tolerance.  Continue current dose of Depakote

## 2019-02-14 LAB — T-HELPER CELL (CD4) - (RCID CLINIC ONLY)
CD4 % Helper T Cell: 29 % — ABNORMAL LOW (ref 33–55)
CD4 T Cell Abs: 400 /uL (ref 400–2700)

## 2019-02-15 LAB — CBC
HCT: 44 % (ref 38.5–50.0)
Hemoglobin: 15.2 g/dL (ref 13.2–17.1)
MCH: 32.6 pg (ref 27.0–33.0)
MCHC: 34.5 g/dL (ref 32.0–36.0)
MCV: 94.4 fL (ref 80.0–100.0)
MPV: 13.3 fL — ABNORMAL HIGH (ref 7.5–12.5)
Platelets: 119 10*3/uL — ABNORMAL LOW (ref 140–400)
RBC: 4.66 10*6/uL (ref 4.20–5.80)
RDW: 12.4 % (ref 11.0–15.0)
WBC: 4.9 10*3/uL (ref 3.8–10.8)

## 2019-02-15 LAB — HIV-1 RNA QUANT-NO REFLEX-BLD
HIV 1 RNA Quant: <20 copies/mL
HIV-1 RNA Quant, Log: <1.3 Log copies/mL

## 2019-02-15 LAB — RPR: RPR Ser Ql: NONREACTIVE

## 2019-04-03 ENCOUNTER — Other Ambulatory Visit: Payer: Self-pay | Admitting: Internal Medicine

## 2019-04-03 DIAGNOSIS — G40909 Epilepsy, unspecified, not intractable, without status epilepticus: Secondary | ICD-10-CM

## 2019-05-07 ENCOUNTER — Telehealth: Payer: Self-pay | Admitting: Family

## 2019-05-07 NOTE — Telephone Encounter (Signed)
COVID-19 Pre-Screening Questions:05/07/19 ° ° °Do you currently have a fever (>100 °F), chills or unexplained body aches? NO ° °• Are you currently experiencing new cough, shortness of breath, sore throat, runny nose? NO °•  °Have you recently travelled outside the state of [place] in the last 14 days?NO °•  °Have you been in contact with someone that is currently pending confirmation of Covid19 testing or has been confirmed to have the Covid19 virus?  NO ° °**If the patient answers NO to ALL questions -  advise the patient to please call the clinic before coming to the office should any symptoms develop.  ° °

## 2019-05-08 ENCOUNTER — Ambulatory Visit: Payer: BLUE CROSS/BLUE SHIELD | Admitting: Family

## 2019-05-08 ENCOUNTER — Encounter: Payer: Self-pay | Admitting: Family

## 2019-05-08 ENCOUNTER — Other Ambulatory Visit: Payer: Self-pay

## 2019-05-08 VITALS — BP 110/72 | HR 73 | Temp 98.3°F | Wt 124.0 lb

## 2019-05-08 DIAGNOSIS — Z23 Encounter for immunization: Secondary | ICD-10-CM

## 2019-05-08 DIAGNOSIS — B20 Human immunodeficiency virus [HIV] disease: Secondary | ICD-10-CM | POA: Diagnosis not present

## 2019-05-08 DIAGNOSIS — R569 Unspecified convulsions: Secondary | ICD-10-CM

## 2019-05-08 DIAGNOSIS — R634 Abnormal weight loss: Secondary | ICD-10-CM

## 2019-05-08 DIAGNOSIS — Z0000 Encounter for general adult medical examination without abnormal findings: Secondary | ICD-10-CM

## 2019-05-08 MED ORDER — MOMETASONE FUROATE 0.1 % EX SOLN: Freq: Every day | CUTANEOUS | 3 refills | Status: DC

## 2019-05-08 MED ORDER — GENVOYA 150-150-200-10 MG PO TABS: 1.0000 | ORAL_TABLET | Freq: Every day | ORAL | 1 refills | Status: DC

## 2019-05-08 NOTE — Patient Instructions (Signed)
Nice to see you!  We will check your blood work today.  Continue to take your Genvoya as prescribed.  Refills have been sent to the pharmacy.  Continue to monitor your weight.  Schedule an appointment for a flu shot in September.  Plan for follow up in 4 months or sooner if needed with lab work same day.  Have a great summer and stay safe!

## 2019-05-08 NOTE — Assessment & Plan Note (Signed)
Stable with no recent seizures and maintained on Depakote.  Continue current dose of Depakote.

## 2019-05-08 NOTE — Assessment & Plan Note (Signed)
   Menveo updated today  Dental referral to [facility] placed for routine dental care.  Discussed importance of safe sexual practice to reduce the risk of acquisition/transmission of STI.  Condoms declined.

## 2019-05-08 NOTE — Assessment & Plan Note (Signed)
Mitchell Leonard has well-controlled HIV disease with good adherence and tolerance to his ART regimen of Genvoya.  No signs/symptoms of opportunistic infection or progressive HIV disease at present.  Overall feeling well.  Has no problems obtaining his medication from the pharmacy.  Check blood work today.  Continue current dose of Genvoya.  Plan for follow-up in 4 months or sooner if needed with lab work on the same day.

## 2019-05-08 NOTE — Progress Notes (Signed)
Subjective:    Patient ID: Mitchell Leonard, male    DOB: 1971-09-24, 48 y.o.   MRN: [id number]  Chief Complaint  Patient presents with  . HIV Positive/AIDS     HPI:  Mitchell Leonard is a 48 y.o. male with HIV disease last seen in the office on 02/13/2019 with good adherence and tolerance to his ART regimen of Genvoya.  Noted to have concerns for weight loss at that time.  Viral load was found to be undetectable with CD4 count of 400.  No recent blood work completed.  Health care maintenance due includes dental screening and second dose of Menveo.  Mitchell Leonard continues to take his Genvoya as prescribed with no adverse side effects or missed doses.  Overall feeling well today and eating and drinking well.  No new concerns. Denies fevers, chills, night sweats, headaches, changes in vision, neck pain/stiffness, nausea, diarrhea, vomiting, lesions or rashes.  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  [facility] and has no problems obtaining his medication from the pharmacy.  Denies feelings of being down, depressed, or hopeless recently.  Remains sexually active and uses condoms with no new partners.  He does drink alcohol on occasion which is very infrequent.  No recreational or illicit drug use or tobacco use.  Hobbies have included watching Netflix and movies.   No Known Allergies    Outpatient Medications Prior to Visit  Medication Sig Dispense Refill  . divalproex (DEPAKOTE ER) 500 MG 24 hr tablet TAKE 2 TABLETS BY MOUTH EVERY DAY 180 tablet 1  . methocarbamol (ROBAXIN) 500 MG tablet Take 1 tablet (500 mg total) by mouth every 8 (eight) hours as needed for muscle spasms. 30 tablet 0  . elvitegravir-cobicistat-emtricitabine-tenofovir (GENVOYA) 150-150-200-10 MG TABS tablet Take 1 tablet by mouth daily with breakfast. 90 tablet 0  . mometasone (ELOCON) 0.1 % lotion Apply topically daily. To affected area to your legs 60  mL 1   No facility-administered medications prior to visit.      Past Medical History:  Diagnosis Date  . HIV (human immunodeficiency virus infection) (HCC)   . Seizure disorder, secondary (HCC)      History reviewed. No pertinent surgical history.     Review of Systems  Constitutional: Negative for appetite change, chills, fatigue, fever and unexpected weight change.  Eyes: Negative for visual disturbance.  Respiratory: Negative for cough, chest tightness, shortness of breath and wheezing.   Cardiovascular: Negative for chest pain and leg swelling.  Gastrointestinal: Negative for abdominal pain, constipation, diarrhea, nausea and vomiting.  Genitourinary: Negative for dysuria, flank pain, frequency, genital sores, hematuria and urgency.  Skin: Negative for rash.  Allergic/Immunologic: Negative for immunocompromised state.  Neurological: Negative for dizziness and headaches.      Objective:    BP 110/72   Pulse 73   Temp 98.3 F (36.8 C) (Oral)   Wt 124 lb (56.2 kg)   BMI 19.42 kg/m  Nursing note and vital signs reviewed.  Physical Exam Constitutional:      General: He is not in acute distress.    Appearance: He is well-developed.  Eyes:     Conjunctiva/sclera: Conjunctivae normal.  Neck:     Musculoskeletal: Neck supple.  Cardiovascular:     Rate and Rhythm: Normal rate and regular rhythm.     Heart sounds: Normal heart sounds. No murmur. No friction rub. No gallop.   Pulmonary:     Effort: Pulmonary effort  is normal. No respiratory distress.     Breath sounds: Normal breath sounds. No wheezing or rales.  Chest:     Chest wall: No tenderness.  Abdominal:     General: Bowel sounds are normal.     Palpations: Abdomen is soft.     Tenderness: There is no abdominal tenderness.  Lymphadenopathy:     Cervical: No cervical adenopathy.  Skin:    General: Skin is warm and dry.     Findings: No rash.  Neurological:     Mental Status: He is alert and oriented  to person, place, and time.  Psychiatric:        Behavior: Behavior normal.        Thought Content: Thought content normal.        Judgment: Judgment normal.      Depression screen [facility] 2/9 05/08/2019 02/13/2019 03/05/2018 10/30/2017 12/07/2016  Decreased Interest 0 0 0 0 0  Down, Depressed, Hopeless 0 0 0 0 0  PHQ - 2 Score 0 0 0 0 0       Assessment & Plan:   Problem List Items Addressed This Visit      Other   HIV disease [facility] [address])    Mr. Koller has well-controlled HIV disease with good adherence and tolerance to his ART regimen of Genvoya.  No signs/symptoms of opportunistic infection or progressive HIV disease at present.  Overall feeling well.  Has no problems obtaining his medication from the pharmacy.  Check blood work today.  Continue current dose of Genvoya.  Plan for follow-up in 4 months or sooner if needed with lab work on the same day.      Relevant Medications   elvitegravir-cobicistat-emtricitabine-tenofovir (GENVOYA) 150-150-200-10 MG TABS tablet   Other Relevant Orders   COMPLETE METABOLIC PANEL WITH GFR   HIV-1 RNA quant-no reflex-bld   T-helper cell (CD4)- ([facility] clinic only)   Seizures ([place]) - Primary    Stable with no recent seizures and maintained on Depakote.  Continue current dose of Depakote.      Weight loss    Weight loss is stable despite losing 3 pounds.  He is eating and drinking well.  Once again consider cancer screenings if weight loss continues and/or possibly addition of Megace for a trial.  To help with weight gain.  BMI remains at 19 and normal weight.  Continue to monitor      Healthcare maintenance     Menveo updated today  Dental referral to [facility] placed for routine dental care.  Discussed importance of safe sexual practice to reduce the risk of acquisition/transmission of STI.  Condoms declined.          I am having Lochlin Frances maintain his methocarbamol, divalproex, Genvoya, and mometasone.   Meds ordered this encounter   Medications  . elvitegravir-cobicistat-emtricitabine-tenofovir (GENVOYA) 150-150-200-10 MG TABS tablet    Sig: Take 1 tablet by mouth daily with breakfast.    Dispense:  90 tablet    Refill:  1    Order Specific Question:   Supervising Provider    Answer:   Carlyle Basques [[id number]]  . mometasone (ELOCON) 0.1 % lotion    Sig: Apply topically daily. To affected area to your legs    Dispense:  60 mL    Refill:  3    Order Specific Question:   Supervising Provider    Answer:   Carlyle Basques [[id number]]     Follow-up: Return in about 4 months (around 09/08/2019), or if symptoms worsen  or fail to improve.   Terri Piedra, MSN, FNP-C Nurse Practitioner [facility] for Infectious Disease [place] number: [phone]

## 2019-05-08 NOTE — Assessment & Plan Note (Signed)
Weight loss is stable despite losing 3 pounds.  He is eating and drinking well.  Once again consider cancer screenings if weight loss continues and/or possibly addition of Megace for a trial.  To help with weight gain.  BMI remains at 19 and normal weight.  Continue to monitor

## 2019-05-09 ENCOUNTER — Ambulatory Visit: Payer: BLUE CROSS/BLUE SHIELD | Admitting: Family

## 2019-05-09 LAB — T-HELPER CELL (CD4) - (RCID CLINIC ONLY)
CD4 % Helper T Cell: 34 % (ref 33–65)
CD4 T Cell Abs: 377 /uL — ABNORMAL LOW (ref 400–1790)

## 2019-05-14 LAB — HIV-1 RNA QUANT-NO REFLEX-BLD
HIV 1 RNA Quant: <20 copies/mL
HIV-1 RNA Quant, Log: <1.3 Log copies/mL

## 2019-05-14 LAB — COMPLETE METABOLIC PANEL WITH GFR
AG Ratio: 1.4 (calc) (ref 1.0–2.5)
ALT: 16 U/L (ref 9–46)
AST: 19 U/L (ref 10–40)
Albumin: 4.4 g/dL (ref 3.6–5.1)
Alkaline phosphatase (APISO): 50 U/L (ref 36–130)
BUN: 9 mg/dL (ref 7–25)
CO2: 26 mmol/L (ref 20–32)
Calcium: 9.4 mg/dL (ref 8.6–10.3)
Chloride: 103 mmol/L (ref 98–110)
Creat: 0.86 mg/dL (ref 0.60–1.35)
GFR, Est African American: 121 mL/min/{1.73_m2} (ref >=60)
GFR, Est Non African American: 104 mL/min/{1.73_m2} (ref >=60)
Globulin: 3.2 g/dL (calc) (ref 1.9–3.7)
Glucose, Bld: 89 mg/dL (ref 65–99)
Potassium: 4.5 mmol/L (ref 3.5–5.3)
Sodium: 139 mmol/L (ref 135–146)
Total Bilirubin: 0.3 mg/dL (ref 0.2–1.2)
Total Protein: 7.6 g/dL (ref 6.1–8.1)

## 2019-09-15 ENCOUNTER — Ambulatory Visit: Payer: BC Managed Care – PPO | Admitting: Family

## 2019-09-16 ENCOUNTER — Other Ambulatory Visit: Payer: Self-pay

## 2019-09-16 ENCOUNTER — Ambulatory Visit: Payer: BC Managed Care – PPO | Admitting: Family

## 2019-09-16 ENCOUNTER — Encounter: Payer: Self-pay | Admitting: Family

## 2019-09-16 ENCOUNTER — Other Ambulatory Visit (HOSPITAL_COMMUNITY)
Admission: RE | Admit: 2019-09-16 | Discharge: 2019-09-16 | Disposition: A | Discharge status: Home / Self Care | Payer: BC Managed Care – PPO | Source: Ambulatory Visit | Attending: Family | Admitting: Family

## 2019-09-16 VITALS — BP 139/77 | HR 80 | Wt 126.0 lb

## 2019-09-16 DIAGNOSIS — Z113 Encounter for screening for infections with a predominantly sexual mode of transmission: Secondary | ICD-10-CM

## 2019-09-16 DIAGNOSIS — M26622 Arthralgia of left temporomandibular joint: Secondary | ICD-10-CM | POA: Diagnosis not present

## 2019-09-16 DIAGNOSIS — B20 Human immunodeficiency virus [HIV] disease: Secondary | ICD-10-CM

## 2019-09-16 DIAGNOSIS — Z79899 Other long term (current) drug therapy: Secondary | ICD-10-CM

## 2019-09-16 DIAGNOSIS — Z0000 Encounter for general adult medical examination without abnormal findings: Secondary | ICD-10-CM

## 2019-09-16 MED ORDER — CORTISPORIN-TC 3.3-3-10-0.5 MG/ML OT SUSP: 3.0000 [drp] | Freq: Four times a day (QID) | OTIC | 0 refills | Status: DC

## 2019-09-16 MED ORDER — GENVOYA 150-150-200-10 MG PO TABS: 1.0000 | ORAL_TABLET | Freq: Every day | ORAL | 1 refills | Status: DC

## 2019-09-16 MED ORDER — NAPROXEN 500 MG PO TABS: 500.0000 mg | ORAL_TABLET | Freq: Two times a day (BID) | ORAL | 0 refills | Status: DC

## 2019-09-16 NOTE — Patient Instructions (Addendum)
Nice to see you.  We will check your blood work today,.  Continue to take the [facility] as prescribed.  Ear drops and anti-inflammation medications have been sent to the pharmacy.   We will plan to follow up in 4 months or sooner if needed with lab work on the same day.

## 2019-09-16 NOTE — Assessment & Plan Note (Signed)
Mr. 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  Will start naproxen sodium 500 mg twice daily for inflammation.  May need referral to maxillofacial surgery if symptoms do not improve.  Although unlikely cannot rule out otitis externa and will start Cortisporin x5 days.

## 2019-09-16 NOTE — Progress Notes (Signed)
Subjective:    Patient ID: Mitchell Leonard, male    DOB: [birth year], 48 y.o.   MRN: [id number]  No chief complaint on file.    HPI:  Mitchell Leonard is a 48 y.o. male with HIV disease who was was last seen in the clinic on 05/08/2019 for routine follow-up with good adherence and tolerance to his ART regimen of Genvoya.  Viral load at the time was undetectable with CD4 count of 377.  No recent blood work has been completed for review.  All immunizations are up-to-date per recommendations.  Mitchell Leonard continues to take his Genvoya as prescribed no adverse side effects or missed doses since his last office visit.  He has been having new onset jaw/ear pain located on the left side going on for about 4 days and described as sharp at times.  Worsened when eating and opening and closing his mouth.  No fevers, chills, sweats, changes in hearing, or drainage noted.  Mitchell Leonard has no problems obtaining his medication from the pharmacy and remains covered through [facility].  Denies feelings of being down, depressed, or hopeless recently.  No recreational or illicit drug use, tobacco use, or alcohol consumption.  Is not currently sexually active.  No Known Allergies   Outpatient Medications Prior to Visit  Medication Sig Dispense Refill  . divalproex (DEPAKOTE ER) 500 MG 24 hr tablet TAKE 2 TABLETS BY MOUTH EVERY DAY 180 tablet 1  . methocarbamol (ROBAXIN) 500 MG tablet Take 1 tablet (500 mg total) by mouth every 8 (eight) hours as needed for muscle spasms. 30 tablet 0  . mometasone (ELOCON) 0.1 % lotion Apply topically daily. To affected area to your legs 60 mL 3  . elvitegravir-cobicistat-emtricitabine-tenofovir (GENVOYA) 150-150-200-10 MG TABS tablet Take 1 tablet by mouth daily with breakfast. 90 tablet 1   No facility-administered medications prior to visit.      Past Medical History:  Diagnosis Date  . HIV (human immunodeficiency virus infection) ([place])   . Seizure  disorder, secondary ([place])      History reviewed. No pertinent surgical history.   Review of Systems  Constitutional: Negative for appetite change, chills, fatigue, fever and unexpected weight change.  HENT: Positive for ear pain. Negative for ear discharge, facial swelling, sinus pressure, sneezing, sore throat, tinnitus and trouble swallowing.   Eyes: Negative for visual disturbance.  Respiratory: Negative for cough, chest tightness, shortness of breath and wheezing.   Cardiovascular: Negative for chest pain and leg swelling.  Gastrointestinal: Negative for abdominal pain, constipation, diarrhea, nausea and vomiting.  Genitourinary: Negative for dysuria, flank pain, frequency, genital sores, hematuria and urgency.  Skin: Negative for rash.  Allergic/Immunologic: Negative for immunocompromised state.  Neurological: Negative for dizziness and headaches.      Objective:    BP 139/77   Pulse 80   Wt 126 lb (57.2 kg)   BMI 19.73 kg/m  Nursing note and vital signs reviewed.  Physical Exam Constitutional:      General: He is not in acute distress.    Appearance: He is well-developed.     Comments: Seated in the chair; pleasant  HENT:     Right Ear: Tympanic membrane and external ear normal. There is no impacted cerumen.     Left Ear: Tympanic membrane and external ear normal. No decreased hearing noted. No laceration, drainage, swelling or tenderness.  No middle ear effusion. There is no impacted cerumen.     Ears:   Cardiovascular:     Rate  and Rhythm: Normal rate and regular rhythm.     Heart sounds: Normal heart sounds.  Pulmonary:     Effort: Pulmonary effort is normal.     Breath sounds: Normal breath sounds.  Skin:    General: Skin is warm and dry.  Neurological:     Mental Status: He is alert and oriented to person, place, and time.  Psychiatric:        Behavior: Behavior normal.        Thought Content: Thought content normal.        Judgment: Judgment normal.       Depression screen [facility] 2/9 09/16/2019 05/08/2019 02/13/2019 03/05/2018 10/30/2017  Decreased Interest 0 0 0 0 0  Down, Depressed, Hopeless 0 0 0 0 0  PHQ - 2 Score 0 0 0 0 0       Assessment & Plan:    Patient Active Problem List   Diagnosis Date Noted  . Arthralgia of left temporomandibular joint 09/16/2019  . Weight loss 02/13/2019  . Healthcare maintenance 02/13/2019  . HIV disease (HCC) 10/21/2015  . Seizures (HCC) 10/21/2015  . GERD (gastroesophageal reflux disease) 10/21/2015     Problem List Items Addressed This Visit      Musculoskeletal and Integument   Arthralgia of left temporomandibular joint    Mitchell Leonard has signs/symptoms consistent with temporomandibular joint arthralgia.  Will start naproxen sodium 500 mg twice daily for inflammation.  May need referral to maxillofacial surgery if symptoms do not improve.  Although unlikely cannot rule out otitis externa and will start Cortisporin x5 days.      Relevant Medications   naproxen (NAPROSYN) 500 MG tablet     Other   HIV disease (HCC) - Primary    [name] continues to have well-controlled HIV disease with good adherence and tolerance to his ART regimen of Genvoya.  No signs/symptoms of opportunistic infection or progressive HIV disease.  He has no problems obtaining his medication from the pharmacy.  We reviewed his previous lab work and discussed the plan of care.  Continue current dose of Genvoya.  Check blood work today.  Plan for follow-up in 4 months or sooner if needed with lab work on the same day.      Relevant Medications   elvitegravir-cobicistat-emtricitabine-tenofovir (GENVOYA) 150-150-200-10 MG TABS tablet   Other Relevant Orders   COMPLETE METABOLIC PANEL WITH GFR   HIV-1 RNA quant-no reflex-bld   T-helper cell (CD4)- ([facility] clinic only)   Healthcare maintenance     All immunizations are up-to-date per recommendations.  Discussed importance of safe sexual practice to reduce risk of STI.   Condoms declined.       Other Visit Diagnoses    Screening for STDs (sexually transmitted diseases)       Relevant Orders   RPR   Urine cytology ancillary only([place])   Pharmacologic therapy       Relevant Orders   Lipid panel       I am having Mitchell Leonard start on Cortisporin-TC and naproxen. I am also having him maintain his methocarbamol, divalproex, mometasone, and Genvoya.   Meds ordered this encounter  Medications  . neomycin-colistin-hydrocortisone-thonzonium (CORTISPORIN-TC) 3.12-23-08-0.5 MG/ML OTIC suspension    Sig: Place 3 drops into the left ear 4 (four) times daily.    Dispense:  10 mL    Refill:  0    Order Specific Question:   Supervising Provider    Answer:   Judyann Munson [[id number]]  . naproxen (  NAPROSYN) 500 MG tablet    Sig: Take 1 tablet (500 mg total) by mouth 2 (two) times daily with a meal.    Dispense:  20 tablet    Refill:  0    Order Specific Question:   Supervising Provider    Answer:   Judyann MunsonSNIDER, CYNTHIA [[id number]]  . elvitegravir-cobicistat-emtricitabine-tenofovir (GENVOYA) 150-150-200-10 MG TABS tablet    Sig: Take 1 tablet by mouth daily with breakfast.    Dispense:  90 tablet    Refill:  1    Order Specific Question:   Supervising Provider    Answer:   Judyann MunsonSNIDER, CYNTHIA [[id number]]     Follow-up: Return in about 4 months (around 01/14/2020), or if symptoms worsen or fail to improve.   Marcos EkeGreg Emonee Winkowski, MSN, FNP-C Nurse Practitioner [facility] Main number: [phone]

## 2019-09-16 NOTE — Assessment & Plan Note (Signed)
   All immunizations are up-to-date per recommendations.  Discussed importance of safe sexual practice to reduce risk of STI.  Condoms declined. 

## 2019-09-16 NOTE — Assessment & Plan Note (Signed)
Mr. Kille continues to have well-controlled HIV disease with good adherence and tolerance to his ART regimen of Genvoya.  No signs/symptoms of opportunistic infection or progressive HIV disease.  He has no problems obtaining his medication from the pharmacy.  We reviewed his previous lab work and discussed the plan of care.  Continue current dose of Genvoya.  Check blood work today.  Plan for follow-up in 4 months or sooner if needed with lab work on the same day.

## 2019-09-17 DIAGNOSIS — M26609 Unspecified temporomandibular joint disorder, unspecified side: Secondary | ICD-10-CM | POA: Diagnosis not present

## 2019-09-17 DIAGNOSIS — H109 Unspecified conjunctivitis: Secondary | ICD-10-CM | POA: Diagnosis not present

## 2019-09-17 DIAGNOSIS — H60502 Unspecified acute noninfective otitis externa, left ear: Secondary | ICD-10-CM | POA: Diagnosis not present

## 2019-09-17 LAB — URINE CYTOLOGY ANCILLARY ONLY
Chlamydia: NEGATIVE
Comment: NEGATIVE
Comment: NORMAL
Neisseria Gonorrhea: NEGATIVE

## 2019-09-17 LAB — T-HELPER CELL (CD4) - (RCID CLINIC ONLY)
CD4 % Helper T Cell: 35 % (ref 33–65)
CD4 T Cell Abs: 445 /uL (ref 400–1790)

## 2019-09-22 LAB — COMPLETE METABOLIC PANEL WITH GFR
AG Ratio: 1.4 (calc) (ref 1.0–2.5)
ALT: 11 U/L (ref 9–46)
AST: 14 U/L (ref 10–40)
Albumin: 4.4 g/dL (ref 3.6–5.1)
Alkaline phosphatase (APISO): 52 U/L (ref 36–130)
BUN: 17 mg/dL (ref 7–25)
CO2: 30 mmol/L (ref 20–32)
Calcium: 9.2 mg/dL (ref 8.6–10.3)
Chloride: 102 mmol/L (ref 98–110)
Creat: 0.68 mg/dL (ref 0.60–1.35)
GFR, Est African American: 132 mL/min/{1.73_m2} (ref >=60)
GFR, Est Non African American: 114 mL/min/{1.73_m2} (ref >=60)
Globulin: 3.1 g/dL (calc) (ref 1.9–3.7)
Glucose, Bld: 86 mg/dL (ref 65–99)
Potassium: 4.6 mmol/L (ref 3.5–5.3)
Sodium: 139 mmol/L (ref 135–146)
Total Bilirubin: 0.2 mg/dL (ref 0.2–1.2)
Total Protein: 7.5 g/dL (ref 6.1–8.1)

## 2019-09-22 LAB — LIPID PANEL
Cholesterol: 149 mg/dL (ref <200)
HDL: 49 mg/dL (ref >=40)
LDL Cholesterol (Calc): 83 mg/dL (calc)
Non-HDL Cholesterol (Calc): 100 mg/dL (calc) (ref <130)
Total CHOL/HDL Ratio: 3 (calc) (ref <5.0)
Triglycerides: 77 mg/dL (ref <150)

## 2019-09-22 LAB — RPR: RPR Ser Ql: NONREACTIVE

## 2019-09-22 LAB — HIV-1 RNA QUANT-NO REFLEX-BLD
HIV 1 RNA Quant: <20 copies/mL
HIV-1 RNA Quant, Log: <1.3 Log copies/mL

## 2019-11-15 ENCOUNTER — Other Ambulatory Visit: Payer: Self-pay | Admitting: Family

## 2019-11-15 DIAGNOSIS — B20 Human immunodeficiency virus [HIV] disease: Secondary | ICD-10-CM

## 2019-11-26 ENCOUNTER — Other Ambulatory Visit: Payer: Self-pay | Admitting: Internal Medicine

## 2019-11-26 DIAGNOSIS — G40909 Epilepsy, unspecified, not intractable, without status epilepticus: Secondary | ICD-10-CM

## 2020-01-13 ENCOUNTER — Ambulatory Visit: Payer: BC Managed Care – PPO | Admitting: Family

## 2020-03-30 ENCOUNTER — Other Ambulatory Visit: Payer: Self-pay

## 2020-03-30 DIAGNOSIS — B20 Human immunodeficiency virus [HIV] disease: Secondary | ICD-10-CM

## 2020-03-30 MED ORDER — GENVOYA 150-150-200-10 MG PO TABS: 1.0000 | ORAL_TABLET | Freq: Every day | ORAL | 0 refills | Status: DC

## 2020-04-08 ENCOUNTER — Ambulatory Visit: Payer: Self-pay | Admitting: Family

## 2020-04-23 ENCOUNTER — Emergency Department: Payer: BC Managed Care – PPO

## 2020-04-23 ENCOUNTER — Emergency Department
Admission: EM | Admit: 2020-04-23 | Discharge: 2020-04-23 | Disposition: A | Discharge status: Home / Self Care | Payer: BC Managed Care – PPO | Attending: Emergency Medicine | Admitting: Emergency Medicine

## 2020-04-23 ENCOUNTER — Encounter: Payer: Self-pay | Admitting: Emergency Medicine

## 2020-04-23 ENCOUNTER — Other Ambulatory Visit: Payer: Self-pay

## 2020-04-23 DIAGNOSIS — R072 Precordial pain: Secondary | ICD-10-CM | POA: Diagnosis not present

## 2020-04-23 DIAGNOSIS — Y939 Activity, unspecified: Secondary | ICD-10-CM | POA: Insufficient documentation

## 2020-04-23 DIAGNOSIS — S161XXA Strain of muscle, fascia and tendon at neck level, initial encounter: Secondary | ICD-10-CM | POA: Insufficient documentation

## 2020-04-23 DIAGNOSIS — S301XXA Contusion of abdominal wall, initial encounter: Secondary | ICD-10-CM | POA: Diagnosis not present

## 2020-04-23 DIAGNOSIS — Y92410 Unspecified street and highway as the place of occurrence of the external cause: Secondary | ICD-10-CM | POA: Diagnosis not present

## 2020-04-23 DIAGNOSIS — Y999 Unspecified external cause status: Secondary | ICD-10-CM | POA: Insufficient documentation

## 2020-04-23 DIAGNOSIS — R109 Unspecified abdominal pain: Secondary | ICD-10-CM | POA: Diagnosis not present

## 2020-04-23 DIAGNOSIS — S3991XA Unspecified injury of abdomen, initial encounter: Secondary | ICD-10-CM | POA: Diagnosis not present

## 2020-04-23 DIAGNOSIS — S20211A Contusion of right front wall of thorax, initial encounter: Secondary | ICD-10-CM | POA: Insufficient documentation

## 2020-04-23 DIAGNOSIS — S14109A Unspecified injury at unspecified level of cervical spinal cord, initial encounter: Secondary | ICD-10-CM | POA: Diagnosis not present

## 2020-04-23 DIAGNOSIS — M542 Cervicalgia: Secondary | ICD-10-CM | POA: Diagnosis not present

## 2020-04-23 DIAGNOSIS — S299XXA Unspecified injury of thorax, initial encounter: Secondary | ICD-10-CM | POA: Diagnosis not present

## 2020-04-23 LAB — COMPREHENSIVE METABOLIC PANEL
ALT: 21 U/L (ref 0–44)
AST: 24 U/L (ref 15–41)
Albumin: 4.1 g/dL (ref 3.5–5.0)
Alkaline Phosphatase: 53 U/L (ref 38–126)
Anion gap: 8 (ref 5–15)
BUN: 16 mg/dL (ref 6–20)
CO2: 29 mmol/L (ref 22–32)
Calcium: 8.7 mg/dL — ABNORMAL LOW (ref 8.9–10.3)
Chloride: 100 mmol/L (ref 98–111)
Creatinine, Ser: 0.88 mg/dL (ref 0.61–1.24)
GFR calc Af Amer: >60 mL/min (ref >60)
GFR calc non Af Amer: >60 mL/min (ref >60)
Glucose, Bld: 101 mg/dL — ABNORMAL HIGH (ref 70–99)
Potassium: 4.2 mmol/L (ref 3.5–5.1)
Sodium: 137 mmol/L (ref 135–145)
Total Bilirubin: 0.6 mg/dL (ref 0.3–1.2)
Total Protein: 8.3 g/dL — ABNORMAL HIGH (ref 6.5–8.1)

## 2020-04-23 LAB — URINALYSIS, COMPLETE (UACMP) WITH MICROSCOPIC
Bacteria, UA: NONE SEEN
Bilirubin Urine: NEGATIVE
Glucose, UA: NEGATIVE mg/dL
Hgb urine dipstick: NEGATIVE
Ketones, ur: 5 mg/dL — AB
Leukocytes,Ua: NEGATIVE
Nitrite: NEGATIVE
Protein, ur: NEGATIVE mg/dL
Specific Gravity, Urine: 1.025 (ref 1.005–1.030)
Squamous Epithelial / HPF: NONE SEEN (ref 0–5)
pH: 5 (ref 5.0–8.0)

## 2020-04-23 LAB — CBC WITH DIFFERENTIAL/PLATELET
Abs Immature Granulocytes: 0.02 10*3/uL (ref 0.00–0.07)
Basophils Absolute: 0 10*3/uL (ref 0.0–0.1)
Basophils Relative: 1 %
Eosinophils Absolute: 0.2 10*3/uL (ref 0.0–0.5)
Eosinophils Relative: 2 %
HCT: 43.7 % (ref 39.0–52.0)
Hemoglobin: 15 g/dL (ref 13.0–17.0)
Immature Granulocytes: 0 %
Lymphocytes Relative: 30 %
Lymphs Abs: 2.1 10*3/uL (ref 0.7–4.0)
MCH: 32.3 pg (ref 26.0–34.0)
MCHC: 34.3 g/dL (ref 30.0–36.0)
MCV: 94.2 fL (ref 80.0–100.0)
Monocytes Absolute: 0.5 10*3/uL (ref 0.1–1.0)
Monocytes Relative: 8 %
Neutro Abs: 4.1 10*3/uL (ref 1.7–7.7)
Neutrophils Relative %: 59 %
Platelets: 123 10*3/uL — ABNORMAL LOW (ref 150–400)
RBC: 4.64 MIL/uL (ref 4.22–5.81)
RDW: 12.2 % (ref 11.5–15.5)
WBC: 6.9 10*3/uL (ref 4.0–10.5)
nRBC: 0 % (ref 0.0–0.2)

## 2020-04-23 MED ORDER — METHOCARBAMOL 500 MG PO TABS: 500.0000 mg | ORAL_TABLET | Freq: Four times a day (QID) | ORAL | 0 refills | Status: DC

## 2020-04-23 MED ORDER — MELOXICAM 15 MG PO TABS: 15.0000 mg | ORAL_TABLET | Freq: Every day | ORAL | 0 refills | Status: DC

## 2020-04-23 MED ORDER — IOHEXOL 300 MG/ML  SOLN
100.0000 mL | Freq: Once | INTRAMUSCULAR | Status: AC | PRN
  Administered 2020-04-23: 100 mL via INTRAVENOUS
  Filled 2020-04-23: qty 100

## 2020-04-23 MED ORDER — SODIUM CHLORIDE 0.9 % IV BOLUS
1000.0000 mL | Freq: Once | INTRAVENOUS | Status: AC
  Administered 2020-04-23: 1000 mL via INTRAVENOUS
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
## 2020-09-14 DIAGNOSIS — R110 Nausea: Secondary | ICD-10-CM | POA: Diagnosis not present

## 2020-09-14 DIAGNOSIS — Z20822 Contact with and (suspected) exposure to covid-19: Secondary | ICD-10-CM | POA: Diagnosis not present

## 2020-10-07 DIAGNOSIS — Z21 Asymptomatic human immunodeficiency virus [HIV] infection status: Secondary | ICD-10-CM | POA: Diagnosis not present

## 2020-10-07 DIAGNOSIS — J029 Acute pharyngitis, unspecified: Secondary | ICD-10-CM | POA: Diagnosis not present

## 2020-10-12 ENCOUNTER — Other Ambulatory Visit: Payer: BC Managed Care – PPO

## 2020-10-12 ENCOUNTER — Other Ambulatory Visit: Payer: Self-pay

## 2020-10-18 ENCOUNTER — Other Ambulatory Visit: Payer: Self-pay

## 2020-10-18 ENCOUNTER — Emergency Department
Admission: EM | Admit: 2020-10-18 | Discharge: 2020-10-18 | Disposition: A | Discharge status: Home / Self Care | Payer: BC Managed Care – PPO | Attending: Emergency Medicine | Admitting: Emergency Medicine

## 2020-10-18 ENCOUNTER — Emergency Department
Admission: EM | Admit: 2020-10-18 | Discharge: 2020-10-18 | Discharge status: Against Medical Advice | Payer: BC Managed Care – PPO

## 2020-10-18 DIAGNOSIS — R0789 Other chest pain: Secondary | ICD-10-CM | POA: Diagnosis not present

## 2020-10-18 DIAGNOSIS — M456 Ankylosing spondylitis lumbar region: Secondary | ICD-10-CM | POA: Diagnosis not present

## 2020-10-18 DIAGNOSIS — R079 Chest pain, unspecified: Secondary | ICD-10-CM | POA: Diagnosis not present

## 2020-10-18 DIAGNOSIS — U071 COVID-19: Secondary | ICD-10-CM | POA: Diagnosis not present

## 2020-10-18 MED ORDER — ALBUTEROL SULFATE HFA 108 (90 BASE) MCG/ACT IN AERS
2.0000 | INHALATION_SPRAY | Freq: Four times a day (QID) | RESPIRATORY_TRACT | 2 refills | Status: DC | PRN
Start: 2020-10-18 — End: 2020-10-18

## 2020-10-18 MED ORDER — ALBUTEROL SULFATE HFA 108 (90 BASE) MCG/ACT IN AERS
2.0000 | INHALATION_SPRAY | Freq: Four times a day (QID) | RESPIRATORY_TRACT | 0 refills | Status: DC | PRN
Start: 2020-10-18 — End: 2020-10-21

## 2020-10-18 NOTE — ED Triage Notes (Signed)
Pt in with co chest pain that started before 12/16 when he was dx with Covid. Called PMD but was told to come here. No hx of the same or heart disease. Pt still has cough, no fever.

## 2020-10-18 NOTE — ED Notes (Signed)
Pt called in the WR with no response 

## 2020-10-18 NOTE — ED Notes (Addendum)
Pt ambulatory to STAT desk without difficulty or distress noted; pt reports he is checking on wait time as he has been here since 230 and his name was not called. Upon reviewing chart, pt was registered at 1437, called 3 separate times with no answer and then taken off waiting; pt reports he has been in lobby entire time waiting on his name to be called; pt placed back on waiting for triage and will be taken back to next available exam room

## 2020-10-18 NOTE — ED Provider Notes (Signed)
[facility] Emergency Department Provider Note   ____________________________________________   I have reviewed the triage vital signs and the nursing notes.   HISTORY  Chief Complaint Chest Pain   History limited by: Not Limited   HPI Mitchell Leonard is a 49 y.o. male who presents to the emergency department today because of concerns for chest pain.  Patient states he tested positive for Covid roughly 11 days ago.  States he is contending have symptoms including chest pain and weakness.  He says he was expecting to have felt better by now.  He states the chest pain is located in the center part of his chest.  It is worse when coughing.  Also has pain in his left upper back.  The patient does have HIV but states he is taking his antiretroviral medications.   Records reviewed. Per medical record review patient has a history of HIV, seizures. Recent diagnosis of COVID.  Past Medical History:  Diagnosis Date  . HIV (human immunodeficiency virus infection) (HCC)   . Seizure disorder, secondary [facility])     Patient Active Problem List   Diagnosis Date Noted  . Arthralgia of left temporomandibular joint 09/16/2019  . Weight loss 02/13/2019  . Healthcare maintenance 02/13/2019  . HIV disease (HCC) 10/21/2015  . Seizures (HCC) 10/21/2015  . GERD (gastroesophageal reflux disease) 10/21/2015    No past surgical history on file.  Prior to Admission medications   Medication Sig Start Date End Date Taking? Authorizing Provider  divalproex (DEPAKOTE ER) 500 MG 24 hr tablet TAKE 2 TABLETS BY MOUTH EVERY DAY 11/26/19   Judyann Munson, MD  elvitegravir-cobicistat-emtricitabine-tenofovir (GENVOYA) 150-150-200-10 MG TABS tablet Take 1 tablet by mouth daily. 03/30/20   Veryl Speak, FNP  meloxicam (MOBIC) 15 MG tablet Take 1 tablet (15 mg total) by mouth daily. 04/23/20   Cuthriell, Delorise Royals, PA-C  methocarbamol (ROBAXIN) 500 MG tablet Take 1 tablet (500 mg total) by  mouth every 8 (eight) hours as needed for muscle spasms. 11/06/18   Judyann Munson, MD  methocarbamol (ROBAXIN) 500 MG tablet Take 1 tablet (500 mg total) by mouth 4 (four) times daily. 04/23/20   Cuthriell, Delorise Royals, PA-C  mometasone (ELOCON) 0.1 % lotion Apply topically daily. To affected area to your legs 05/08/19   Veryl Speak, FNP  naproxen (NAPROSYN) 500 MG tablet Take 1 tablet (500 mg total) by mouth 2 (two) times daily with a meal. 09/16/19   Veryl Speak, FNP  neomycin-colistin-hydrocortisone-thonzonium (CORTISPORIN-TC) 3.12-23-08-0.5 MG/ML OTIC suspension Place 3 drops into the left ear 4 (four) times daily. 09/16/19   Veryl Speak, FNP    Allergies Patient has no known allergies.  No family history on file.  Social History Social History   Tobacco Use  . Smoking status: Never Smoker  . Smokeless tobacco: Never Used  Vaping Use  . Vaping Use: Never used  Substance Use Topics  . Alcohol use: No    Alcohol/week: 0.0 standard drinks  . Drug use: No    Review of Systems Constitutional: Positive for generalized weakness. Eyes: No visual changes. ENT: No sore throat. Cardiovascular: Positive for chest pain. Respiratory: Denies shortness of breath. Gastrointestinal: No abdominal pain.  No nausea, no vomiting.  No diarrhea.   Genitourinary: Negative for dysuria. Musculoskeletal: Positive for back pain. Skin: Negative for rash. Neurological: Negative for headaches, focal weakness or numbness.  ____________________________________________   PHYSICAL EXAM:  VITAL SIGNS: ED Triage Vitals  Enc Vitals Group  BP 10/18/20 1934 131/85     Pulse Rate 10/18/20 1934 75     Resp 10/18/20 1934 20     Temp 10/18/20 1931 98.2 F (36.8 C)     Temp Source 10/18/20 1931 Oral     SpO2 10/18/20 1934 100 %     Weight 10/18/20 1932 140 lb (63.5 kg)     Height 10/18/20 1932 5\' 6"  (1.676 m)     Head Circumference --      Peak Flow --      Pain Score 10/18/20 1931 5    Constitutional: Alert and oriented.  Eyes: Conjunctivae are normal.  ENT      Head: Normocephalic and atraumatic.      Nose: No congestion/rhinnorhea.      Mouth/Throat: Mucous membranes are moist.      Neck: No stridor. Hematological/Lymphatic/Immunilogical: No cervical lymphadenopathy. Cardiovascular: Normal rate, regular rhythm.  No murmurs, rubs, or gallops.  Respiratory: Normal respiratory effort without tachypnea nor retractions. Breath sounds are clear and equal bilaterally. No wheezes/rales/rhonchi. Gastrointestinal: Soft and non tender. No rebound. No guarding.  Genitourinary: Deferred Musculoskeletal: Normal range of motion in all extremities. No lower extremity edema. Neurologic:  Normal speech and language. No gross focal neurologic deficits are appreciated.  Skin:  Skin is warm, dry and intact. No rash noted. Psychiatric: Mood and affect are normal. Speech and behavior are normal. Patient exhibits appropriate insight and judgment.  ____________________________________________    LABS (pertinent positives/negatives)  None  ____________________________________________   EKG  I, 10/20/20, attending physician, personally viewed and interpreted this EKG  EKG Time: 1934 Rate: 83 Rhythm: normal sinus rhythm Axis: normal Intervals: qtc 408 QRS: narrow ST changes: no st elevation Impression: normal ekg   ____________________________________________    RADIOLOGY  None  ____________________________________________   PROCEDURES  Procedures  ____________________________________________   INITIAL IMPRESSION / ASSESSMENT AND PLAN / ED COURSE  Pertinent labs & imaging results that were available during my care of the patient were reviewed by me and considered in my medical decision making (see chart for details).   Patient presented to the emergency department today because of concerns for continued chest pain in the setting of Covid diagnosis  roughly 10 days ago.  Patient is not hypoxic tachypneic or hypotensive in the emergency department.  This time I do think likely patient is suffering from Covid.  While I considered the possibility of a blood clot given chest and back pain I think this is extremely unlikely given lack of tachycardia or hypoxia.  Patient without any lower extremity edema or pain. Do think likely he is still symptomatic from COVID. Discussed this with the patient. Will plan on discharging with prescription for albuterol inhaler.   ____________________________________________   FINAL CLINICAL IMPRESSION(S) / ED DIAGNOSES  Final diagnoses:  COVID-19     Note: This dictation was prepared with Dragon dictation. Any transcriptional errors that result from this process are unintentional     Phineas Semen, MD 10/18/20 2053

## 2020-10-18 NOTE — Discharge Instructions (Addendum)
Please seek medical attention for any high fevers, chest pain, shortness of breath, change in behavior, persistent vomiting, bloody stool or any other new or concerning symptoms.  

## 2020-10-21 ENCOUNTER — Encounter: Payer: Self-pay | Admitting: Family

## 2020-10-21 ENCOUNTER — Ambulatory Visit (INDEPENDENT_AMBULATORY_CARE_PROVIDER_SITE_OTHER): Payer: BC Managed Care – PPO | Admitting: Family

## 2020-10-21 VITALS — BP 117/81 | HR 83 | Temp 97.9°F | Ht 66.0 in | Wt 136.0 lb

## 2020-10-21 DIAGNOSIS — B20 Human immunodeficiency virus [HIV] disease: Secondary | ICD-10-CM | POA: Diagnosis not present

## 2020-10-21 DIAGNOSIS — Z0000 Encounter for general adult medical examination without abnormal findings: Secondary | ICD-10-CM

## 2020-10-21 DIAGNOSIS — Z79899 Other long term (current) drug therapy: Secondary | ICD-10-CM | POA: Diagnosis not present

## 2020-10-21 DIAGNOSIS — Z113 Encounter for screening for infections with a predominantly sexual mode of transmission: Secondary | ICD-10-CM | POA: Diagnosis not present

## 2020-10-21 LAB — T-HELPER CELL (CD4) - (RCID CLINIC ONLY)
CD4 % Helper T Cell: 32 % — ABNORMAL LOW (ref 33–65)
CD4 T Cell Abs: 489 /uL (ref 400–1790)

## 2020-10-21 MED ORDER — MOMETASONE FUROATE 0.1 % EX SOLN: Freq: Every day | CUTANEOUS | 3 refills | Status: DC

## 2020-10-21 MED ORDER — GENVOYA 150-150-200-10 MG PO TABS: 1.0000 | ORAL_TABLET | Freq: Every day | ORAL | 5 refills | Status: DC

## 2020-10-21 NOTE — Progress Notes (Signed)
Subjective:    Patient ID: Mitchell Leonard, male    DOB: 1970-11-17, 49 y.o.   MRN: [id number]  Chief Complaint  Patient presents with  . Follow-up    Declined condoms      HPI:  Mitchell Leonard is a 49 y.o. male with HIV disease who was last seen on 09/16/2019 with good adherence and tolerance to his ART regimen of Genvoya.  Viral load at the time was undetectable with CD4 count of 445.  No recent blood work completed.  Here today for routine follow-up.  Mitchell Leonard continues to take his Genvoya daily as prescribed with no adverse side effects or missed doses since his last office visit.  Overall feeling well today with continued right-sided abdominal pain on occasion of unclear origin. Denies fevers, chills, night sweats, headaches, changes in vision, neck pain/stiffness, nausea, diarrhea, vomiting, lesions or rashes.  Mitchell Leonard has no problems obtaining medication from the pharmacy.  Denies feelings of being down, depressed, or hopeless recently.  No recreational or illicit drug use or tobacco use.  Alcohol use is occasional.  Due for routine dental care.  Declines influenza vaccination and condoms.   No Known Allergies    Outpatient Medications Prior to Visit  Medication Sig Dispense Refill  . divalproex (DEPAKOTE ER) 500 MG 24 hr tablet TAKE 2 TABLETS BY MOUTH EVERY DAY 180 tablet 1  . Ibuprofen (ADVIL PO) Take by mouth.    . elvitegravir-cobicistat-emtricitabine-tenofovir (GENVOYA) 150-150-200-10 MG TABS tablet Take 1 tablet by mouth daily. 30 tablet 0  . albuterol (VENTOLIN HFA) 108 (90 Base) MCG/ACT inhaler Inhale 2 puffs into the lungs every 6 (six) hours as needed for wheezing or shortness of breath. (Patient not taking: Reported on 10/21/2020) 8 g 0  . meloxicam (MOBIC) 15 MG tablet Take 1 tablet (15 mg total) by mouth daily. (Patient not taking: Reported on 10/21/2020) 30 tablet 0  . methocarbamol (ROBAXIN) 500 MG tablet Take 1 tablet (500 mg total) by mouth every 8  (eight) hours as needed for muscle spasms. (Patient not taking: Reported on 10/21/2020) 30 tablet 0  . methocarbamol (ROBAXIN) 500 MG tablet Take 1 tablet (500 mg total) by mouth 4 (four) times daily. (Patient not taking: Reported on 10/21/2020) 16 tablet 0  . mometasone (ELOCON) 0.1 % lotion Apply topically daily. To affected area to your legs (Patient not taking: Reported on 10/21/2020) 60 mL 3  . naproxen (NAPROSYN) 500 MG tablet Take 1 tablet (500 mg total) by mouth 2 (two) times daily with a meal. (Patient not taking: Reported on 10/21/2020) 20 tablet 0  . neomycin-colistin-hydrocortisone-thonzonium (CORTISPORIN-TC) 3.12-23-08-0.5 MG/ML OTIC suspension Place 3 drops into the left ear 4 (four) times daily. (Patient not taking: Reported on 10/21/2020) 10 mL 0   No facility-administered medications prior to visit.     Past Medical History:  Diagnosis Date  . HIV (human immunodeficiency virus infection) (HCC)   . Seizure disorder, secondary (HCC)      History reviewed. No pertinent surgical history.     Review of Systems  Constitutional: Negative for appetite change, chills, fatigue, fever and unexpected weight change.  Eyes: Negative for visual disturbance.  Respiratory: Negative for cough, chest tightness, shortness of breath and wheezing.   Cardiovascular: Negative for chest pain and leg swelling.  Gastrointestinal: Negative for abdominal pain, constipation, diarrhea, nausea and vomiting.  Genitourinary: Negative for dysuria, flank pain, frequency, genital sores, hematuria and urgency.  Skin: Negative for rash.  Allergic/Immunologic: Negative for immunocompromised state.  Neurological: Negative for dizziness and headaches.      Objective:    BP 117/81   Pulse 83   Temp 97.9 F (36.6 C) (Oral)   Ht 5\' 6"  (1.676 m)   Wt 136 lb (61.7 kg)   SpO2 99%   BMI 21.95 kg/m  Nursing note and vital signs reviewed.  Physical Exam Constitutional:      General: He is not in acute  distress.    Appearance: He is well-developed.  HENT:     Mouth/Throat:     Mouth: Oropharynx is clear and moist.  Eyes:     Conjunctiva/sclera: Conjunctivae normal.  Cardiovascular:     Rate and Rhythm: Normal rate and regular rhythm.     Pulses: Intact distal pulses.     Heart sounds: Normal heart sounds. No murmur heard. No friction rub. No gallop.   Pulmonary:     Effort: Pulmonary effort is normal. No respiratory distress.     Breath sounds: Normal breath sounds. No wheezing or rales.  Chest:     Chest wall: No tenderness.  Abdominal:     General: Bowel sounds are normal.     Palpations: Abdomen is soft.     Tenderness: There is no abdominal tenderness.  Musculoskeletal:     Cervical back: Neck supple.  Lymphadenopathy:     Cervical: No cervical adenopathy.  Skin:    General: Skin is warm and dry.     Findings: No rash.  Neurological:     Mental Status: He is alert and oriented to person, place, and time.  Psychiatric:        Mood and Affect: Mood and affect normal.        Behavior: Behavior normal.        Thought Content: Thought content normal.        Judgment: Judgment normal.      Depression screen [facility] 2/9 10/21/2020 09/16/2019 05/08/2019 02/13/2019 03/05/2018  Decreased Interest 0 0 0 0 0  Down, Depressed, Hopeless 0 0 0 0 0  PHQ - 2 Score 0 0 0 0 0       Assessment & Plan:    Patient Active Problem List   Diagnosis Date Noted  . Arthralgia of left temporomandibular joint 09/16/2019  . Weight loss 02/13/2019  . Healthcare maintenance 02/13/2019  . HIV disease (HCC) 10/21/2015  . Seizures (HCC) 10/21/2015  . GERD (gastroesophageal reflux disease) 10/21/2015     Problem List Items Addressed This Visit      Other   HIV disease [facility])    Mitchell Leonard continues to have well-controlled HIV disease with good adherence and tolerance to his ART regimen of Genvoya.  No signs/symptoms of opportunistic infection or progressive HIV disease.  Reviewed previous  lab work and discussed plan of care.  Check blood work today.  Continue current dose of Genvoya.  Plan for follow-up in 4 months or sooner if needed with lab work 1 to 2 weeks prior to appointment on same day.      Relevant Medications   elvitegravir-cobicistat-emtricitabine-tenofovir (GENVOYA) 150-150-200-10 MG TABS tablet   Other Relevant Orders   COMPLETE METABOLIC PANEL WITH GFR   HIV-1 RNA quant-no reflex-bld   T-helper cell (CD4)- ([facility] clinic only) (Completed)   Healthcare maintenance     Discussed importance of safe sexual practice to reduce risk of STI.  Condoms declined.  Declines influenza vaccine today.  Recommend routine dental care with private insurance.  If unable to will schedule with  [facility] dental clinic.       Other Visit Diagnoses    Screening for STDs (sexually transmitted diseases)    -  Primary   Relevant Orders   RPR   Pharmacologic therapy       Relevant Orders   Lipid panel       I have discontinued Mitchell Leonard's methocarbamol, Cortisporin-TC, naproxen, meloxicam, methocarbamol, and albuterol. I am also having him maintain his divalproex, Ibuprofen (ADVIL PO), Genvoya, and mometasone.   Meds ordered this encounter  Medications  . elvitegravir-cobicistat-emtricitabine-tenofovir (GENVOYA) 150-150-200-10 MG TABS tablet    Sig: Take 1 tablet by mouth daily.    Dispense:  30 tablet    Refill:  5    Order Specific Question:   Supervising Provider    Answer:   Judyann Munson [[id number]]  . mometasone (ELOCON) 0.1 % lotion    Sig: Apply topically daily. To affected area to your legs    Dispense:  60 mL    Refill:  3    Order Specific Question:   Supervising Provider    Answer:   Judyann Munson [[id number]]     Follow-up: Return in about 4 months (around 02/19/2021), or if symptoms worsen or fail to improve.   Marcos Eke, MSN, FNP-C Nurse Practitioner [facility] Main number: [phone]

## 2020-10-21 NOTE — Assessment & Plan Note (Signed)
Mitchell Leonard continues to have well-controlled HIV disease with good adherence and tolerance to his ART regimen of Genvoya.  No signs/symptoms of opportunistic infection or progressive HIV disease.  Reviewed previous lab work and discussed plan of care.  Check blood work today.  Continue current dose of Genvoya.  Plan for follow-up in 4 months or sooner if needed with lab work 1 to 2 weeks prior to appointment on same day.

## 2020-10-21 NOTE — Assessment & Plan Note (Signed)
   Discussed importance of safe sexual practice to reduce risk of STI.  Condoms declined.  Declines influenza vaccine today.  Recommend routine dental care with private insurance.  If unable to will schedule with [facility] dental clinic.

## 2020-10-21 NOTE — Patient Instructions (Signed)
Nice to see you.  We will check your lab work today.  Continue to take your Genvoya daily as prescribed.   Refills will be sent to the pharmacy.   Plan for follow up in 4 months or sooner if needed.   Have a great day and stay safe!

## 2020-11-01 LAB — COMPLETE METABOLIC PANEL WITH GFR
AG Ratio: 1.4 (calc) (ref 1.0–2.5)
ALT: 27 U/L (ref 9–46)
AST: 18 U/L (ref 10–40)
Albumin: 4.9 g/dL (ref 3.6–5.1)
Alkaline phosphatase (APISO): 57 U/L (ref 36–130)
BUN: 12 mg/dL (ref 7–25)
CO2: 30 mmol/L (ref 20–32)
Calcium: 10.6 mg/dL — ABNORMAL HIGH (ref 8.6–10.3)
Chloride: 99 mmol/L (ref 98–110)
Creat: 0.86 mg/dL (ref 0.60–1.35)
GFR, Est African American: 119 mL/min/{1.73_m2} (ref >=60)
GFR, Est Non African American: 103 mL/min/{1.73_m2} (ref >=60)
Globulin: 3.5 g/dL (calc) (ref 1.9–3.7)
Glucose, Bld: 85 mg/dL (ref 65–99)
Potassium: 4.7 mmol/L (ref 3.5–5.3)
Sodium: 138 mmol/L (ref 135–146)
Total Bilirubin: 0.3 mg/dL (ref 0.2–1.2)
Total Protein: 8.4 g/dL — ABNORMAL HIGH (ref 6.1–8.1)

## 2020-11-01 LAB — HIV-1 RNA QUANT-NO REFLEX-BLD
HIV 1 RNA Quant: <20 Copies/mL
HIV-1 RNA Quant, Log: <1.3 Log cps/mL

## 2020-11-01 LAB — RPR: RPR Ser Ql: NONREACTIVE

## 2020-11-01 LAB — LIPID PANEL
Cholesterol: 181 mg/dL (ref <200)
HDL: 51 mg/dL (ref >=40)
LDL Cholesterol (Calc): 113 mg/dL (calc) — ABNORMAL HIGH
Non-HDL Cholesterol (Calc): 130 mg/dL (calc) — ABNORMAL HIGH (ref <130)
Total CHOL/HDL Ratio: 3.5 (calc) (ref <5.0)
Triglycerides: 74 mg/dL (ref <150)

## 2021-01-01 ENCOUNTER — Other Ambulatory Visit: Payer: Self-pay | Admitting: Internal Medicine

## 2021-01-01 DIAGNOSIS — G40909 Epilepsy, unspecified, not intractable, without status epilepticus: Secondary | ICD-10-CM

## 2021-01-25 ENCOUNTER — Other Ambulatory Visit: Payer: Self-pay | Admitting: Family

## 2021-01-25 DIAGNOSIS — B20 Human immunodeficiency virus [HIV] disease: Secondary | ICD-10-CM

## 2021-02-10 ENCOUNTER — Ambulatory Visit: Payer: BC Managed Care – PPO | Admitting: Family

## 2021-02-17 ENCOUNTER — Other Ambulatory Visit: Payer: Self-pay | Admitting: Neurology

## 2021-02-17 DIAGNOSIS — R531 Weakness: Secondary | ICD-10-CM

## 2021-03-01 ENCOUNTER — Other Ambulatory Visit: Payer: Self-pay

## 2021-03-01 ENCOUNTER — Ambulatory Visit
Admission: RE | Admit: 2021-03-01 | Discharge: 2021-03-01 | Disposition: A | Discharge status: Home / Self Care | Payer: BC Managed Care – PPO | Source: Ambulatory Visit | Attending: Neurology | Admitting: Neurology

## 2021-03-01 DIAGNOSIS — R531 Weakness: Secondary | ICD-10-CM | POA: Insufficient documentation

## 2021-03-01 MED ORDER — GADOBUTROL 1 MMOL/ML IV SOLN
6.0000 mL | Freq: Once | INTRAVENOUS | Status: AC | PRN
  Administered 2021-03-01: 6 mL via INTRAVENOUS

## 2021-06-03 ENCOUNTER — Other Ambulatory Visit: Payer: Self-pay | Admitting: Family

## 2021-06-03 DIAGNOSIS — B20 Human immunodeficiency virus [HIV] disease: Secondary | ICD-10-CM

## 2021-06-06 ENCOUNTER — Telehealth: Payer: Self-pay

## 2021-06-06 NOTE — Telephone Encounter (Signed)
Patient overdue for follow-up. Called patient to schedule, no answer and unable to leave voicemail. Will send MyChart message.   Sandie Ano, RN

## 2021-07-09 ENCOUNTER — Other Ambulatory Visit: Payer: Self-pay | Admitting: Family

## 2021-07-09 DIAGNOSIS — B20 Human immunodeficiency virus [HIV] disease: Secondary | ICD-10-CM

## 2021-08-07 ENCOUNTER — Other Ambulatory Visit: Payer: Self-pay | Admitting: Internal Medicine

## 2021-08-07 DIAGNOSIS — G40909 Epilepsy, unspecified, not intractable, without status epilepticus: Secondary | ICD-10-CM

## 2021-08-08 ENCOUNTER — Encounter: Payer: Self-pay | Admitting: Emergency Medicine

## 2021-08-08 ENCOUNTER — Emergency Department
Admission: EM | Admit: 2021-08-08 | Discharge: 2021-08-08 | Disposition: A | Discharge status: Home / Self Care | Payer: BC Managed Care – PPO | Attending: Emergency Medicine | Admitting: Emergency Medicine

## 2021-08-08 ENCOUNTER — Other Ambulatory Visit: Payer: Self-pay

## 2021-08-08 DIAGNOSIS — Z21 Asymptomatic human immunodeficiency virus [HIV] infection status: Secondary | ICD-10-CM | POA: Diagnosis not present

## 2021-08-08 DIAGNOSIS — U071 COVID-19: Secondary | ICD-10-CM | POA: Insufficient documentation

## 2021-08-08 DIAGNOSIS — J029 Acute pharyngitis, unspecified: Secondary | ICD-10-CM

## 2021-08-08 LAB — RESP PANEL BY RT-PCR (FLU A&B, COVID) ARPGX2
Influenza A by PCR: NEGATIVE
Influenza B by PCR: NEGATIVE
SARS Coronavirus 2 by RT PCR: POSITIVE — AB

## 2021-08-08 LAB — GROUP A STREP BY PCR: Group A Strep by PCR: NOT DETECTED

## 2021-08-08 MED ORDER — KETOROLAC TROMETHAMINE 30 MG/ML IJ SOLN
30.0000 mg | Freq: Once | INTRAMUSCULAR | Status: AC
  Administered 2021-08-08: 30 mg via INTRAMUSCULAR
  Filled 2021-08-08: qty 1

## 2021-08-08 MED ORDER — NIRMATRELVIR/RITONAVIR (PAXLOVID)TABLET: 3.0000 | ORAL_TABLET | Freq: Two times a day (BID) | ORAL | 0 refills | Status: AC

## 2021-08-08 NOTE — Telephone Encounter (Signed)
Please advise on refill. Pt is being followed by [facility].

## 2021-08-08 NOTE — ED Triage Notes (Signed)
Pt c/o sore throat and pain to top of his mouth. Pt states pain started yesterday and has progressively worsened since then. Pt states unknown if any fevers.

## 2021-08-08 NOTE — ED Notes (Signed)
Called twice for rooming without answer

## 2021-08-08 NOTE — ED Provider Notes (Signed)
[facility] Emergency Department Provider Note   ____________________________________________   Event Date/Time   First MD Initiated Contact with Patient 08/08/21 [phone]     (approximate)  I have reviewed the triage vital signs and the nursing notes.   HISTORY  Chief Complaint Sore Throat    HPI Mitchell Leonard is a 50 y.o. male with past medical history of HIV and seizures who presents to the ED complaining of sore throat.  Patient reports that he has been dealing with increasing pain in his throat for the past 24 hours along with cough and congestion.  He denies any fevers, chest pain, or shortness of breath.  He has not had any nausea, vomiting, diarrhea, or abdominal pain.  He is not aware of any sick contacts.  He has not taken anything for his symptoms prior to arrival.        Past Medical History:  Diagnosis Date   HIV (human immunodeficiency virus infection) (HCC)    Seizure disorder, secondary [facility])     Patient Active Problem List   Diagnosis Date Noted   Arthralgia of left temporomandibular joint 09/16/2019   Weight loss 02/13/2019   Healthcare maintenance 02/13/2019   HIV disease (HCC) 10/21/2015   Seizures (HCC) 10/21/2015   GERD (gastroesophageal reflux disease) 10/21/2015    History reviewed. No pertinent surgical history.  Prior to Admission medications   Medication Sig Start Date End Date Taking? Authorizing Provider  nirmatrelvir/ritonavir EUA (PAXLOVID) 20 x 150 MG & 10 x 100MG  TABS Take 3 tablets by mouth 2 (two) times daily for 5 days. Patient GFR is >60. Take nirmatrelvir (150 mg) two tablets twice daily for 5 days and ritonavir (100 mg) one tablet twice daily for 5 days. 08/08/21 08/13/21 Yes 08/15/21, MD  divalproex (DEPAKOTE ER) 500 MG 24 hr tablet TAKE 2 TABLETS BY MOUTH EVERY DAY 11/26/19   01/24/20, MD  GENVOYA 150-150-200-10 MG TABS tablet TAKE 1 TABLET BY MOUTH EVERY DAY 07/11/21   07/13/21, FNP   Ibuprofen (ADVIL PO) Take by mouth.    [provider]  mometasone (ELOCON) 0.1 % lotion Apply topically daily. To affected area to your legs 10/21/20   10/23/20, FNP    Allergies Patient has no known allergies.  History reviewed. No pertinent family history.  Social History Social History   Tobacco Use   Smoking status: Never   Smokeless tobacco: Never  Vaping Use   Vaping Use: Never used  Substance Use Topics   Alcohol use: Yes    Alcohol/week: 0.0 standard drinks    Comment: socially    Drug use: No    Review of Systems  Constitutional: No fever/chills Eyes: No visual changes. ENT: Positive for congestion and sore throat. Cardiovascular: Denies chest pain. Respiratory: Denies shortness of breath.  Positive for cough. Gastrointestinal: No abdominal pain.  No nausea, no vomiting.  No diarrhea.  No constipation. Genitourinary: Negative for dysuria. Musculoskeletal: Negative for back pain. Skin: Negative for rash. Neurological: Negative for headaches, focal weakness or numbness.  ____________________________________________   PHYSICAL EXAM:  VITAL SIGNS: ED Triage Vitals  Enc Vitals Group     BP 08/08/21 0048 121/90     Pulse Rate 08/08/21 0048 77     Resp 08/08/21 0048 20     Temp 08/08/21 0048 97.7 F (36.5 C)     Temp Source 08/08/21 0048 Oral     SpO2 08/08/21 0048 98 %     Weight  08/08/21 0047 135 lb (61.2 kg)     Height 08/08/21 0047 5\' 6"  (1.676 m)     Head Circumference --      Peak Flow --      Pain Score 08/08/21 0046 6     Pain Loc --      Pain Edu? --      Excl. in GC? --     Constitutional: Alert and oriented. Eyes: Conjunctivae are normal. Head: Atraumatic. Nose: No congestion/rhinnorhea. Mouth/Throat: Mucous membranes are moist.  Posterior oropharynx clear with no erythema, edema, or exudate. Neck: Normal ROM Cardiovascular: Normal rate, regular rhythm. Grossly normal heart sounds. Respiratory: Normal respiratory  effort.  No retractions. Lungs CTAB. Gastrointestinal: Soft and nontender. No distention. Genitourinary: deferred Musculoskeletal: No lower extremity tenderness nor edema. Neurologic:  Normal speech and language. No gross focal neurologic deficits are appreciated. Skin:  Skin is warm, dry and intact. No rash noted. Psychiatric: Mood and affect are normal. Speech and behavior are normal.  ____________________________________________   LABS (all labs ordered are listed, but only abnormal results are displayed)  Labs Reviewed  RESP PANEL BY RT-PCR (FLU A&B, COVID) ARPGX2 - Abnormal; Notable for the following components:      Result Value   SARS Coronavirus 2 by RT PCR POSITIVE (*)    All other components within normal limits  GROUP A STREP BY PCR    PROCEDURES  Procedure(s) performed (including Critical Care):  Procedures   ____________________________________________   INITIAL IMPRESSION / ASSESSMENT AND PLAN / ED COURSE      50 year old male with past medical history of HIV and seizures who presents to the ED with increasing sore throat, congestion, and cough for the past 24 hours.  He is not in any respiratory distress and is maintaining O2 sats on room air.  Oropharynx is clear without erythema, exudates, or edema.  Strep testing is negative but testing for COVID-19 is positive.  Patient is appropriate for outpatient management and we will prescribe Paxlovid given his immunocompromised status.  Per CDC, Paxlovid may be given in conjunction with antiretroviral treatment for HIV.  Patient counseled to follow-up with his PCP and to return to the ED for new worsening symptoms, patient agrees with plan.      ____________________________________________   FINAL CLINICAL IMPRESSION(S) / ED DIAGNOSES  Final diagnoses:  Pharyngitis, unspecified etiology  COVID-19     ED Discharge Orders          Ordered    nirmatrelvir/ritonavir EUA (PAXLOVID) 20 x 150 MG & 10 x 100MG   TABS  2 times daily        08/08/21 0417             Note:  This document was prepared using Dragon voice recognition software and may include unintentional dictation errors.    , MD 08/08/21 [phone]

## 2021-08-12 ENCOUNTER — Telehealth: Payer: Self-pay

## 2021-08-12 NOTE — Telephone Encounter (Signed)
Called patient to get an appointment scheduled, patient said he will call back to get scheduled once he gets his work schedule.

## 2021-08-15 ENCOUNTER — Other Ambulatory Visit: Payer: Self-pay | Admitting: Family

## 2021-08-15 DIAGNOSIS — B20 Human immunodeficiency virus [HIV] disease: Secondary | ICD-10-CM

## 2021-08-16 NOTE — Telephone Encounter (Signed)
Patient contacted to schedule appt for additional refills

## 2021-08-17 ENCOUNTER — Telehealth: Payer: Self-pay

## 2021-08-17 NOTE — Telephone Encounter (Signed)
Left patient a voice mail to call back to schedule a foloow up with Tammy Sours or Dr. Drue Second

## 2021-08-17 NOTE — Telephone Encounter (Signed)
-----   Message from Juanita Laster, [place] sent at 08/12/2021  2:10 PM EDT ----- Regarding: overdue appt Can we schedule this pt a follow up visit. Either with Tammy Sours or Drue Second

## 2021-08-19 ENCOUNTER — Other Ambulatory Visit: Payer: Self-pay | Admitting: Family

## 2021-08-19 DIAGNOSIS — B20 Human immunodeficiency virus [HIV] disease: Secondary | ICD-10-CM

## 2021-08-30 ENCOUNTER — Encounter: Payer: Self-pay | Admitting: Family

## 2021-08-30 ENCOUNTER — Other Ambulatory Visit: Payer: Self-pay

## 2021-08-30 ENCOUNTER — Ambulatory Visit (INDEPENDENT_AMBULATORY_CARE_PROVIDER_SITE_OTHER): Payer: BC Managed Care – PPO | Admitting: Family

## 2021-08-30 VITALS — BP 129/80 | HR 84 | Temp 97.8°F | Resp 16 | Ht 66.0 in | Wt 137.0 lb

## 2021-08-30 DIAGNOSIS — Z113 Encounter for screening for infections with a predominantly sexual mode of transmission: Secondary | ICD-10-CM | POA: Diagnosis not present

## 2021-08-30 DIAGNOSIS — B20 Human immunodeficiency virus [HIV] disease: Secondary | ICD-10-CM | POA: Diagnosis not present

## 2021-08-30 DIAGNOSIS — Z0000 Encounter for general adult medical examination without abnormal findings: Secondary | ICD-10-CM

## 2021-08-30 DIAGNOSIS — Z79899 Other long term (current) drug therapy: Secondary | ICD-10-CM | POA: Diagnosis not present

## 2021-08-30 MED ORDER — GENVOYA 150-150-200-10 MG PO TABS: 1.0000 | ORAL_TABLET | Freq: Every day | ORAL | 6 refills | Status: DC

## 2021-08-30 NOTE — Progress Notes (Signed)
Brief Narrative   Patient ID: Mitchell Leonard, male    DOB: 1971-08-08, 50 y.o.   MRN: [id number]  Mitchell Leonard is a 50 y/o African gentleman initially diagnosed with HIV in 2011 with risk factor of heterosexual contact. CD4 nadir was 16. No known history of opportunistic infection. [id number] negative.  ART experience with Atripla and now Genvoya.   Subjective:    Chief Complaint  Patient presents with   Follow-up    B20 - pt requesting refill on Genvoya - been out of med x 6 days.     HPI:  Mitchell Leonard is a 50 y.o. male with HIV disease last seen on 10/21/2020 with well-controlled virus and good adherence and tolerance to his ART regimen of Genvoya.  Viral load at the time was undetectable with CD4 count of 489.  Here today for routine follow-up.  Mitchell Leonard has been out of medications for approximately 6 days.  Generally takes medication daily as prescribed with no adverse side effects.  Overall feeling well today with no new concerns/complaints.  In between office visits describes having been very sick and having COVID. Denies fevers, chills, night sweats, headaches, changes in vision, neck pain/stiffness, nausea, diarrhea, vomiting, lesions or rashes.  Mitchell Leonard has no problems obtaining medication from the pharmacy and remains covered by [facility]. Denies feelings of being down, depressed or hopeless recently.  No current recreational or illicit drug use, tobacco use, and alcohol consumption is social.  Condoms offered and declined. Declines vaccinations. Has questions about other HIV medications.   No Known Allergies    Outpatient Medications Prior to Visit  Medication Sig Dispense Refill   divalproex (DEPAKOTE ER) 500 MG 24 hr tablet TAKE 2 TABLETS BY MOUTH EVERY DAY 180 tablet 0   Ibuprofen (ADVIL PO) Take by mouth.     mometasone (ELOCON) 0.1 % lotion Apply topically daily. To affected area to your legs 60 mL 3   GENVOYA 150-150-200-10 MG TABS tablet  TAKE 1 TABLET BY MOUTH EVERY DAY (Patient not taking: Reported on 08/30/2021) 30 tablet 0   No facility-administered medications prior to visit.     Past Medical History:  Diagnosis Date   HIV (human immunodeficiency virus infection) (HCC)    Seizure disorder, secondary (HCC)      History reviewed. No pertinent surgical history.    Review of Systems  Constitutional:  Negative for appetite change, chills, fatigue, fever and unexpected weight change.  Eyes:  Negative for visual disturbance.  Respiratory:  Negative for cough, chest tightness, shortness of breath and wheezing.   Cardiovascular:  Negative for chest pain and leg swelling.  Gastrointestinal:  Negative for abdominal pain, constipation, diarrhea, nausea and vomiting.  Genitourinary:  Negative for dysuria, flank pain, frequency, genital sores, hematuria and urgency.  Skin:  Negative for rash.  Allergic/Immunologic: Negative for immunocompromised state.  Neurological:  Negative for dizziness and headaches.     Objective:    BP 129/80   Pulse 84   Temp 97.8 F (36.6 C) (Oral)   Resp 16   Ht 5\' 6"  (1.676 m)   Wt 137 lb (62.1 kg)   SpO2 99%   BMI 22.11 kg/m  Nursing note and vital signs reviewed.  Physical Exam Constitutional:      General: He is not in acute distress.    Appearance: He is well-developed.  Eyes:     Conjunctiva/sclera: Conjunctivae normal.  Cardiovascular:     Rate and Rhythm: Normal rate and regular  rhythm.     Heart sounds: Normal heart sounds. No murmur heard.   No friction rub. No gallop.  Pulmonary:     Effort: Pulmonary effort is normal. No respiratory distress.     Breath sounds: Normal breath sounds. No wheezing or rales.  Chest:     Chest wall: No tenderness.  Abdominal:     General: Bowel sounds are normal.     Palpations: Abdomen is soft.     Tenderness: There is no abdominal tenderness.  Musculoskeletal:     Cervical back: Neck supple.  Lymphadenopathy:     Cervical: No  cervical adenopathy.  Skin:    General: Skin is warm and dry.     Findings: No rash.  Neurological:     Mental Status: He is alert and oriented to person, place, and time.  Psychiatric:        Behavior: Behavior normal.        Thought Content: Thought content normal.        Judgment: Judgment normal.     Depression screen [facility] 2/9 08/30/2021 10/21/2020 09/16/2019 05/08/2019 02/13/2019  Decreased Interest 0 0 0 0 0  Down, Depressed, Hopeless 0 0 0 0 0  PHQ - 2 Score 0 0 0 0 0       Assessment & Plan:    Patient Active Problem List   Diagnosis Date Noted   Arthralgia of left temporomandibular joint 09/16/2019   Weight loss 02/13/2019   Healthcare maintenance 02/13/2019   HIV disease (HCC) 10/21/2015   Seizures (HCC) 10/21/2015   GERD (gastroesophageal reflux disease) 10/21/2015     Problem List Items Addressed This Visit       Other   HIV disease (HCC) - Primary    Mitchell Leonard appears to have well-controlled virus with good adherence and tolerance to his ART regimen of Genvoya.  No signs/symptoms of opportunistic infection.  We reviewed previous lab work and discussed plan of care.  Reminded of importance of routine follow-ups/office visits.  Discussed additional medications including Biktarvy, Dovato, and Cabenuva.  Check blood work today.  Continue current dose of Genvoya.  Plan for follow-up in 6 months or sooner if needed with lab work on the same day.      Relevant Medications   elvitegravir-cobicistat-emtricitabine-tenofovir (GENVOYA) 150-150-200-10 MG TABS tablet   Other Relevant Orders   COMPLETE METABOLIC PANEL WITH GFR   HIV-1 RNA quant-no reflex-bld   T-helper cell (CD4)- ([facility] clinic only)   CBC   Healthcare maintenance    Discussed importance of safe sexual practices and condom use.  Condoms offered Declines vaccinations.      Other Visit Diagnoses     Screening for STDs (sexually transmitted diseases)       Relevant Orders   RPR   Pharmacologic  therapy       Relevant Orders   Lipid panel        I have changed Mitchell Leonard's Genvoya. I am also having him maintain his Ibuprofen (ADVIL PO), mometasone, and divalproex.   Meds ordered this encounter  Medications   elvitegravir-cobicistat-emtricitabine-tenofovir (GENVOYA) 150-150-200-10 MG TABS tablet    Sig: Take 1 tablet by mouth daily.    Dispense:  30 tablet    Refill:  6    Order Specific Question:   Supervising Provider    Answer:   Judyann Munson [[id number]]     Follow-up: Return in about 6 months (around 02/27/2022).   Marcos Eke, MSN, FNP-C Nurse Practitioner [facility] Main number: [phone]

## 2021-08-30 NOTE — Assessment & Plan Note (Signed)
   Discussed importance of safe sexual practices and condom use.  Condoms offered.  Declines vaccinations.  

## 2021-08-30 NOTE — Assessment & Plan Note (Signed)
Mr. Rodarte appears to have well-controlled virus with good adherence and tolerance to his ART regimen of Genvoya.  No signs/symptoms of opportunistic infection.  We reviewed previous lab work and discussed plan of care.  Reminded of importance of routine follow-ups/office visits.  Discussed additional medications including Biktarvy, Dovato, and Cabenuva.  Check blood work today.  Continue current dose of Genvoya.  Plan for follow-up in 6 months or sooner if needed with lab work on the same day.

## 2021-08-30 NOTE — Telephone Encounter (Signed)
Appt 1/18

## 2021-08-30 NOTE — Patient Instructions (Addendum)
Nice to see you.  We will check your lab work today.  Continue to take your medication daily as prescribed.  Refills have been sent to the pharmacy.  Plan for follow up in 6 month or sooner if needed with lab work on the same day.  Have a great day and stay safe!

## 2021-08-31 LAB — T-HELPER CELL (CD4) - (RCID CLINIC ONLY)
CD4 % Helper T Cell: 28 % — ABNORMAL LOW (ref 33–65)
CD4 T Cell Abs: 446 /uL (ref 400–1790)

## 2021-09-01 LAB — COMPLETE METABOLIC PANEL WITH GFR
AG Ratio: 1.4 (calc) (ref 1.0–2.5)
ALT: 11 U/L (ref 9–46)
AST: 19 U/L (ref 10–40)
Albumin: 4.7 g/dL (ref 3.6–5.1)
Alkaline phosphatase (APISO): 57 U/L (ref 36–130)
BUN: 15 mg/dL (ref 7–25)
CO2: 26 mmol/L (ref 20–32)
Calcium: 9.5 mg/dL (ref 8.6–10.3)
Chloride: 105 mmol/L (ref 98–110)
Creat: 0.8 mg/dL (ref 0.60–1.29)
Globulin: 3.4 g/dL (calc) (ref 1.9–3.7)
Glucose, Bld: 81 mg/dL (ref 65–99)
Potassium: 5.1 mmol/L (ref 3.5–5.3)
Sodium: 141 mmol/L (ref 135–146)
Total Bilirubin: 0.3 mg/dL (ref 0.2–1.2)
Total Protein: 8.1 g/dL (ref 6.1–8.1)
eGFR: 108 mL/min/{1.73_m2} (ref >=60)

## 2021-09-01 LAB — CBC
HCT: 44.1 % (ref 38.5–50.0)
Hemoglobin: 15.2 g/dL (ref 13.2–17.1)
MCH: 33.3 pg — ABNORMAL HIGH (ref 27.0–33.0)
MCHC: 34.5 g/dL (ref 32.0–36.0)
MCV: 96.5 fL (ref 80.0–100.0)
MPV: 12.4 fL (ref 7.5–12.5)
Platelets: 151 10*3/uL (ref 140–400)
RBC: 4.57 10*6/uL (ref 4.20–5.80)
RDW: 12.3 % (ref 11.0–15.0)
WBC: 6.7 10*3/uL (ref 3.8–10.8)

## 2021-09-01 LAB — RPR: RPR Ser Ql: NONREACTIVE

## 2021-09-01 LAB — LIPID PANEL
Cholesterol: 156 mg/dL (ref <200)
HDL: 70 mg/dL (ref >=40)
LDL Cholesterol (Calc): 73 mg/dL (calc)
Non-HDL Cholesterol (Calc): 86 mg/dL (calc) (ref <130)
Total CHOL/HDL Ratio: 2.2 (calc) (ref <5.0)
Triglycerides: 44 mg/dL (ref <150)

## 2021-09-01 LAB — HIV-1 RNA QUANT-NO REFLEX-BLD
HIV 1 RNA Quant: 53 Copies/mL — ABNORMAL HIGH
HIV-1 RNA Quant, Log: 1.72 Log cps/mL — ABNORMAL HIGH

## 2021-12-06 ENCOUNTER — Other Ambulatory Visit: Payer: Self-pay | Admitting: Internal Medicine

## 2021-12-06 DIAGNOSIS — G40909 Epilepsy, unspecified, not intractable, without status epilepticus: Secondary | ICD-10-CM

## 2021-12-07 NOTE — Telephone Encounter (Signed)
Patient has neurologist with [facility]. Sent MyChart message to patient to confirm he is still in care with them and to have them take over refills.   Sandie Ano, RN

## 2021-12-08 NOTE — Telephone Encounter (Signed)
Okay to refill or do you want neurology to take over?

## 2022-04-11 ENCOUNTER — Other Ambulatory Visit: Payer: Self-pay | Admitting: Family

## 2022-04-11 DIAGNOSIS — B20 Human immunodeficiency virus [HIV] disease: Secondary | ICD-10-CM

## 2022-04-14 ENCOUNTER — Ambulatory Visit: Payer: Self-pay | Admitting: Family

## 2022-05-03 ENCOUNTER — Other Ambulatory Visit: Payer: Self-pay | Admitting: Family

## 2022-05-03 DIAGNOSIS — B20 Human immunodeficiency virus [HIV] disease: Secondary | ICD-10-CM

## 2022-05-23 ENCOUNTER — Other Ambulatory Visit: Payer: Self-pay | Admitting: Internal Medicine

## 2022-05-23 DIAGNOSIS — G40909 Epilepsy, unspecified, not intractable, without status epilepticus: Secondary | ICD-10-CM

## 2022-05-23 NOTE — Telephone Encounter (Signed)
Please advise if okay to refill. 

## 2022-06-05 ENCOUNTER — Telehealth: Payer: Self-pay

## 2022-06-05 ENCOUNTER — Other Ambulatory Visit: Payer: Self-pay | Admitting: Family

## 2022-06-05 DIAGNOSIS — B20 Human immunodeficiency virus [HIV] disease: Secondary | ICD-10-CM

## 2022-06-05 NOTE — Telephone Encounter (Signed)
Called patient to schedule overdue appointment, no answer and unable to leave message.   Sandie Ano, RN

## 2022-06-15 ENCOUNTER — Telehealth: Payer: Self-pay

## 2022-06-15 ENCOUNTER — Other Ambulatory Visit: Payer: Self-pay

## 2022-06-15 DIAGNOSIS — G40909 Epilepsy, unspecified, not intractable, without status epilepticus: Secondary | ICD-10-CM

## 2022-06-15 MED ORDER — DIVALPROEX SODIUM ER 500 MG PO TB24: 1000.0000 mg | ORAL_TABLET | Freq: Every day | ORAL | 0 refills | Status: DC

## 2022-06-15 NOTE — Telephone Encounter (Signed)
Patient came into office today for refill on Depakote. Per Jeanine Luz, NP 30 day supply sent to pharmacy. Patient will need to contact neurologist for future refills. Patient aware and to also make an appointment with Marcos Eke, NP as he is overdue for office follow up. Patient verbalized his understanding.    Mitchell Leonard Lesli Albee, CMA

## 2022-07-07 ENCOUNTER — Other Ambulatory Visit: Payer: Self-pay | Admitting: Family

## 2022-07-07 DIAGNOSIS — B20 Human immunodeficiency virus [HIV] disease: Secondary | ICD-10-CM

## 2022-07-07 DIAGNOSIS — G40909 Epilepsy, unspecified, not intractable, without status epilepticus: Secondary | ICD-10-CM

## 2022-08-09 ENCOUNTER — Other Ambulatory Visit: Payer: Self-pay | Admitting: Family

## 2022-08-09 DIAGNOSIS — B20 Human immunodeficiency virus [HIV] disease: Secondary | ICD-10-CM

## 2022-08-23 ENCOUNTER — Telehealth: Payer: Self-pay

## 2022-08-23 NOTE — Telephone Encounter (Signed)
Call can not be completed and was unable to leave voice mail, patient is overdue for an appointment with [facility].

## 2022-08-23 NOTE — Telephone Encounter (Signed)
-----   Message from Beryle Flock, RN sent at 08/09/2022  9:53 AM EDT ----- Patient needs follow up scheduled with Marya Amsler, thanks!

## 2022-08-30 ENCOUNTER — Other Ambulatory Visit: Payer: Self-pay

## 2022-08-30 DIAGNOSIS — G40909 Epilepsy, unspecified, not intractable, without status epilepticus: Secondary | ICD-10-CM

## 2022-08-30 DIAGNOSIS — B20 Human immunodeficiency virus [HIV] disease: Secondary | ICD-10-CM

## 2022-08-30 MED ORDER — GENVOYA 150-150-200-10 MG PO TABS: 1.0000 | ORAL_TABLET | Freq: Every day | ORAL | 0 refills | Status: DC

## 2022-08-30 MED ORDER — DIVALPROEX SODIUM ER 500 MG PO TB24: 1000.0000 mg | ORAL_TABLET | Freq: Every day | ORAL | 0 refills | Status: DC

## 2022-08-30 NOTE — Telephone Encounter (Signed)
Patient called from [place] can not come in until after 09/06/22. Advised MUST be seen to get anymore refills on Genvoya and this is last RX for Depakote as he MUST call neurology. Patient aware of plan and will follow up 10/09/22. Sent 30 days of each RX to [facility] Per Marcos Eke.

## 2022-09-21 ENCOUNTER — Other Ambulatory Visit: Payer: Self-pay | Admitting: Family

## 2022-09-21 DIAGNOSIS — B20 Human immunodeficiency virus [HIV] disease: Secondary | ICD-10-CM

## 2022-09-21 DIAGNOSIS — G40909 Epilepsy, unspecified, not intractable, without status epilepticus: Secondary | ICD-10-CM

## 2022-09-22 NOTE — Telephone Encounter (Signed)
Upcoming appointment on 10/09/22.  Called patient to verify preferred pharmacy  for [facility] No answer and unable to leave message. Will send MyChart message.  Wyvonne Lenz, RN

## 2022-09-29 IMAGING — MR MR HEAD WO/W CM
14 of 15 series · 40 of 48 positions shown · IV contrast (6ml Gadavist)
Comparison: None.

CLINICAL DATA: Seizures over the last 10 years. Recent episodes of
right-sided weakness.

EXAM:
MRI HEAD WITHOUT AND WITH CONTRAST
TECHNIQUE: Multiplanar, multiecho pulse sequences of the brain and surrounding
structures were obtained without and with intravenous contrast.
CONTRAST:  6mL GADAVIST GADOBUTROL 1 MMOL/ML IV SOLN

[Series 6: ax dwi_tracew · axial · 3.0mm · 0.65mm/px · z∈[-77,+77]mm · 2 of 48 slices shown]
[im 1/48]
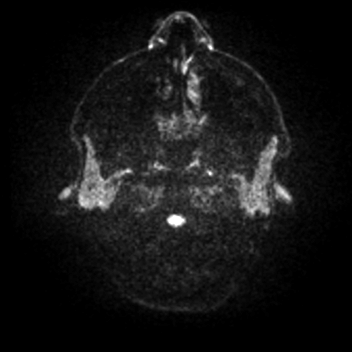
[im 48/48]
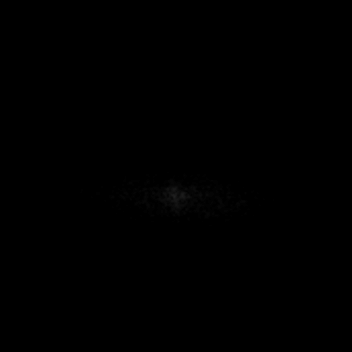

[Series 7: ax dwi_adc · axial · 3.0mm · 0.65mm/px · z∈[-77,+74]mm · 2 of 47 slices shown]
[im 1/47]
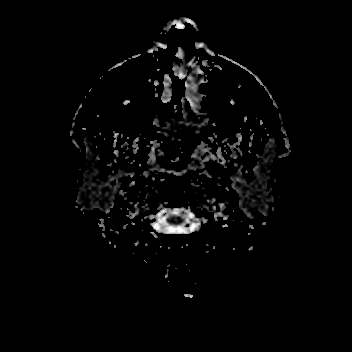
[im 47/47]
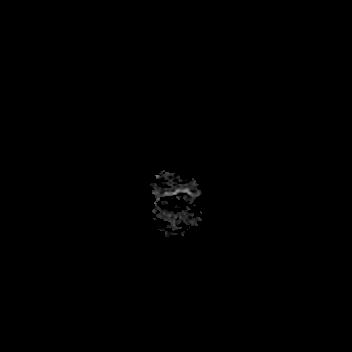

[Series 8: cor dwi_tracew · coronal · 5.0mm · 0.65mm/px · 2 of 38 slices shown]
[im 1/38]
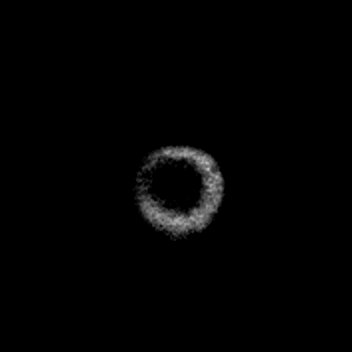
[im 38/38]
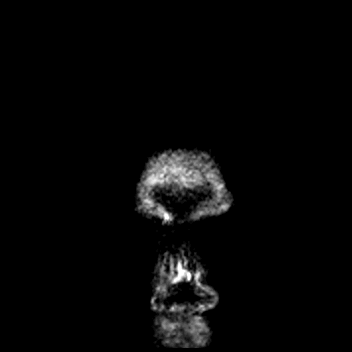

[Series 9: cor dwi_adc · coronal · 5.0mm · 0.65mm/px · 2 of 37 slices shown]
[im 1/37]
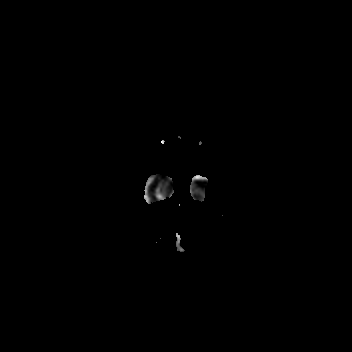
[im 37/37]
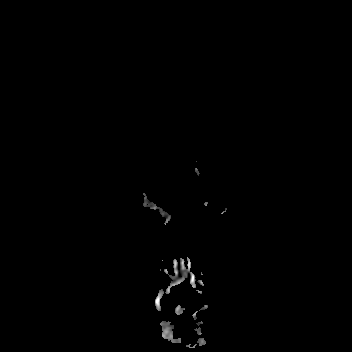

[Series 10: T2 · axial · 5.0mm · 0.53mm/px · 1 of 25 slices shown (1 of 2)]
[im 1/25]
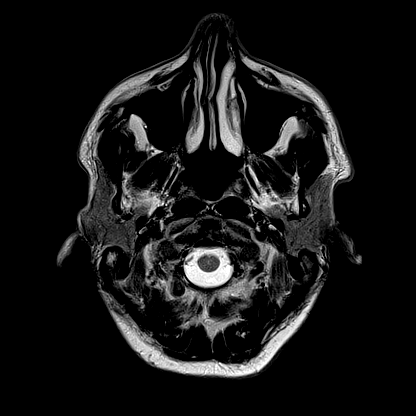

[Series 11: mag_images · axial · 3.0mm · 0.90mm/px · z∈[-84,+92]mm · 3 of 60 slices shown]
[im 1/60]
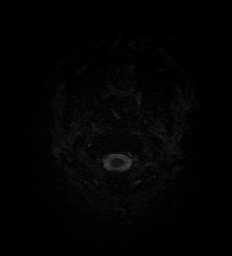
[im 30/60]
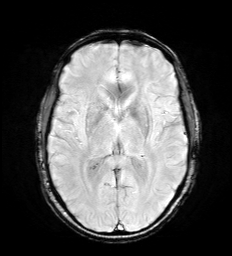
[im 60/60]
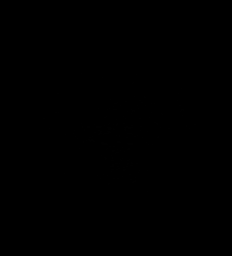

[Series 12: pha_images · axial · 3.0mm · 0.90mm/px · z∈[-84,+92]mm · 3 of 58 slices shown]
[im 1/58]
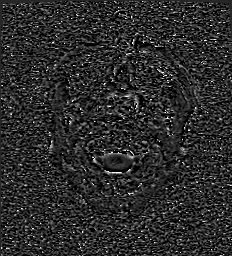
[im 29/58]
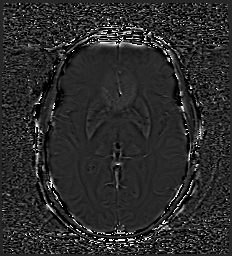
[im 58/58]
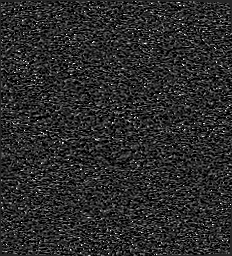

[Series 13: swi_images · axial · 3.0mm · 0.90mm/px · 1 of 59 slices shown]
[im 1/59]
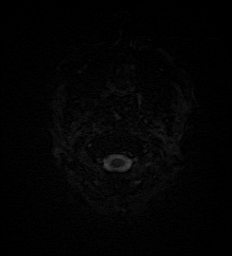

[Series 15: FLAIR · axial · 3.0mm · 0.53mm/px · z∈[-77,+84]mm · 3 of 55 slices shown (1 of 2)]
[im 1/55]
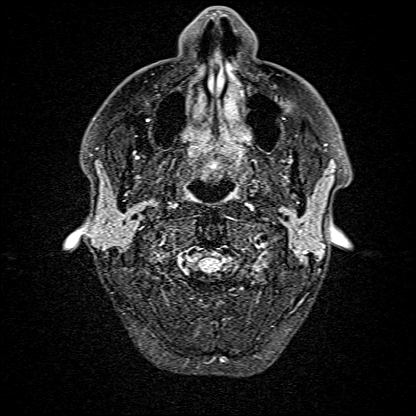
[im 28/55]
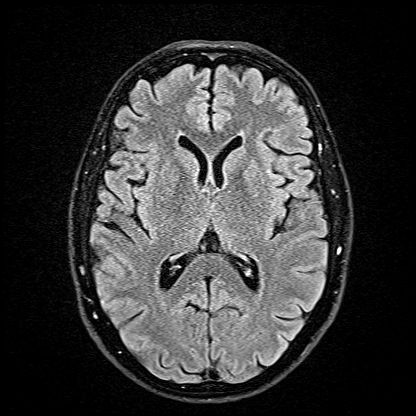
[im 55/55]
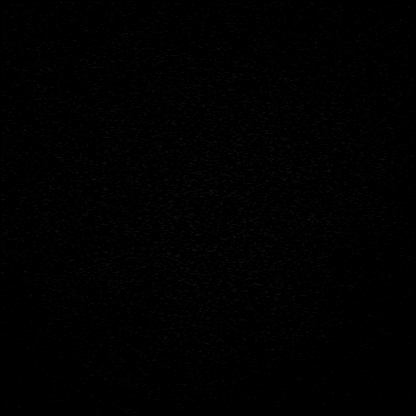

[Series 16: T1 · axial · 1.0mm · 0.98mm/px · z∈[-82,+87]mm · 8 of 171 slices shown]
[im 1/171]
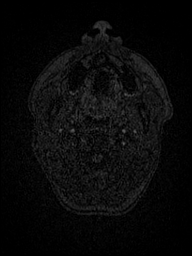
[im 25/171]
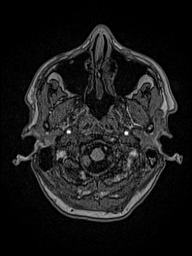
[im 49/171]
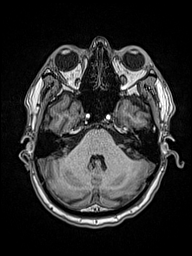
[im 73/171]
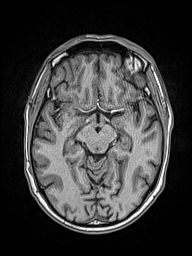
[im 98/171]
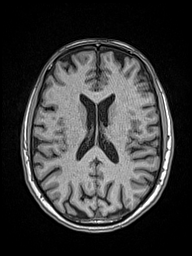
[im 122/171]
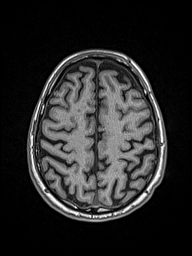
[im 146/171]
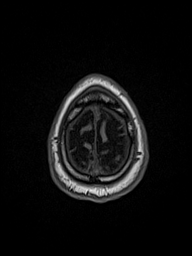
[im 171/171]
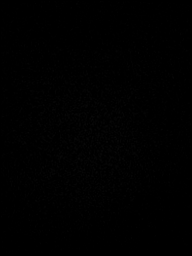

[Series 17: T2 · coronal · 3.0mm · 0.23mm/px · 2 of 35 slices shown (2 of 2)]
[im 1/35]
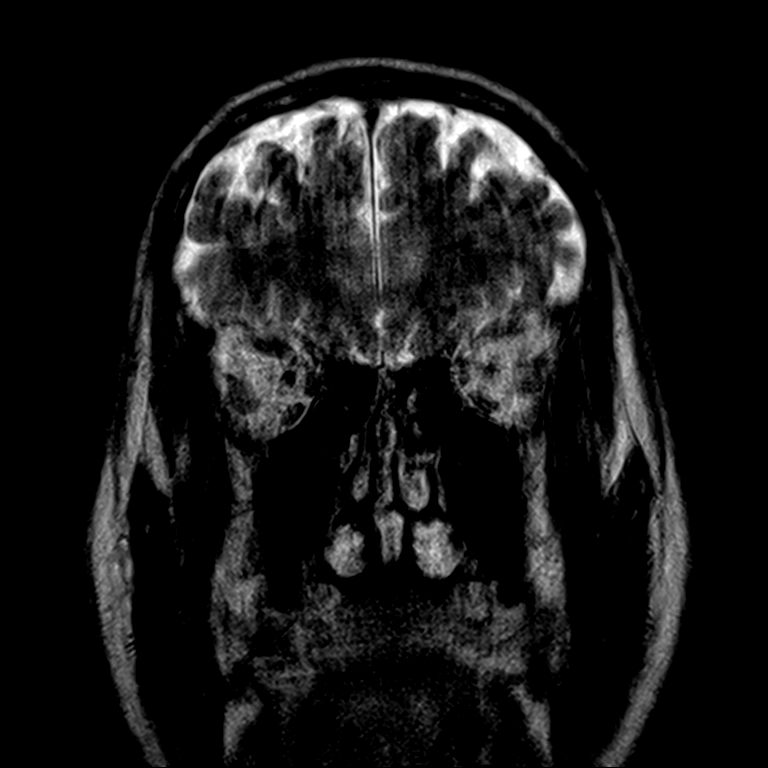
[im 35/35]
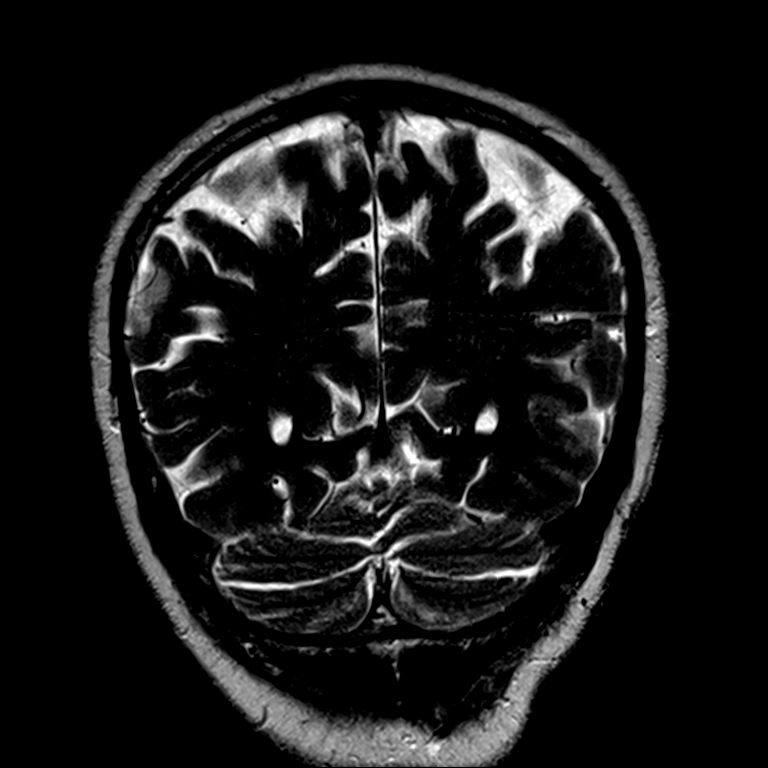

[Series 18: FLAIR · coronal · 3.0mm · 0.35mm/px · 2 of 35 slices shown (2 of 2)]
[im 1/35]
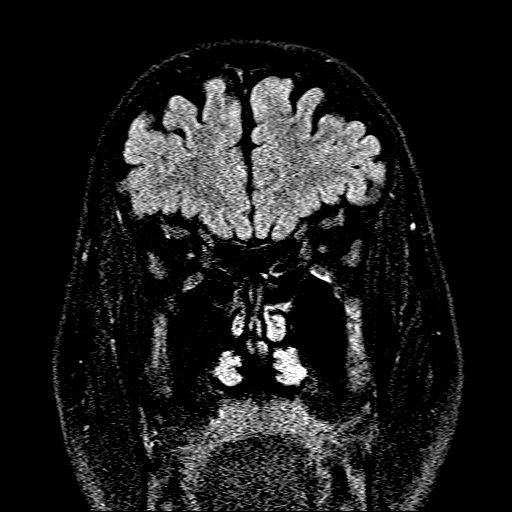
[im 35/35]
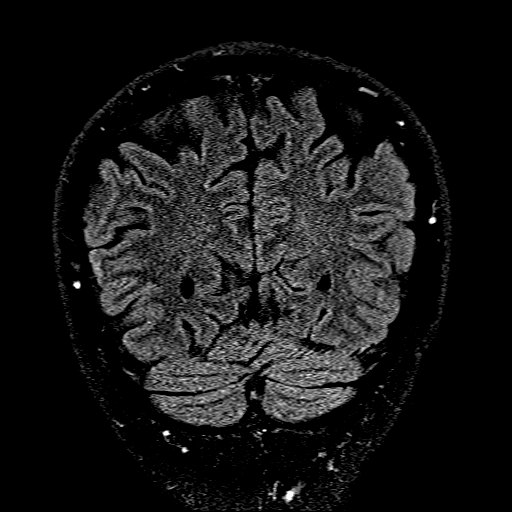

[Series 19: T1 post-contrast · axial · 1.0mm · 0.98mm/px · z∈[-82,+91]mm · 8 of 174 slices shown (1 of 2)]
[im 1/174]
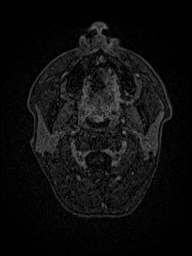
[im 25/174]
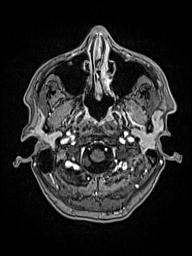
[im 50/174]
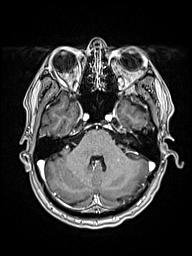
[im 75/174]
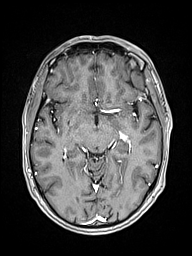
[im 99/174]
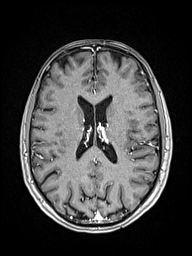
[im 124/174]
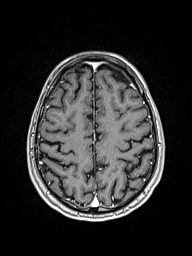
[im 149/174]
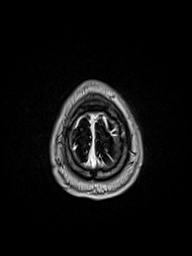
[im 174/174]
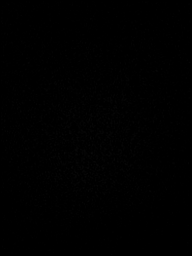

[Series 20: T1 post-contrast · coronal · 5.0mm · 0.57mm/px · 1 of 29 slices shown (2 of 2)]
[im 1/29]
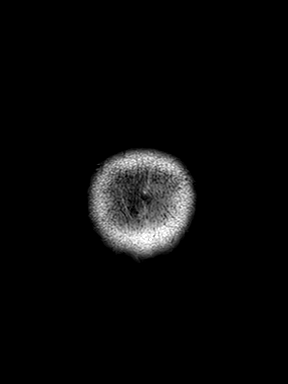

[40 of 48 positions shown; findings below may reference images not displayed]

FINDINGS: Brain: Diffusion imaging does not show any acute or subacute
infarction or other cause of restricted diffusion. No abnormality
affects the brainstem or cerebellum. Cerebral hemispheres show a few
scattered punctate foci of T2 and FLAIR signal within the white
matter. In the medial left posterior frontal lobe, there is a 7 mm
in diameter ovoid lesion within the medial cortex. This shows low
signal on all pulse sequences and shows susceptibility indicating
that it is either calcified or contains hemosiderin. Thin rim of
surrounding brain shows some increased T2 and FLAIR signal and there
is very minimal surrounding edema. The differential diagnosis
includes a calcified tuber, calcified infection sequela such as
neurocysticercosis, thrombosed cavernoma, and calcified low-grade
primary brain tumor. Does the patient have older imaging performed
elsewhere? If not, I would suggest a head CT to assess for
calcification.

No hydrocephalus or extra-axial collection. Mesial temporal lobes
are normal. No pituitary mass.

Vascular: Major vessels at the base of the brain show flow.

Skull and upper cervical spine: Negative

Sinuses/Orbits: Clear/normal

Other: None
IMPRESSION: Focal 7 mm lesion in the medial left posterior frontal lobe centered
in the cortex with full description as above. Differential diagnosis
includes calcified tuber, calcified sequela of neurocysticercosis,
thrombosed cavernoma and calcified low-grade brain tumor. Does the
patient have older imaging performed elsewhere? If not, I would
suggest a head CT to assess for calcification. In any case, this
abnormality could serve as a seizure focus.

## 2022-09-29 NOTE — Telephone Encounter (Signed)
Second attempt to call patient. No answer and call could not be completed; unable to leave message.

## 2022-10-03 NOTE — Progress Notes (Deleted)
Brief Narrative   Patient ID: Mitchell Leonard, male    DOB: 1971-02-14, 51 y.o.   MRN: [id number]  Mr. Dreibelbis is a 51 y/o African gentleman initially diagnosed with HIV in 2011 with risk factor of heterosexual contact. CD4 nadir was 16. No known history of opportunistic infection. [id number] negative. ART experience with Atripla and now Genvoya.   Subjective:    No chief complaint on file.   HPI:  Mitchell Leonard is a 51 y.o. male with HIV disease last seen on 08/30/21 with well controlled virus and good adherence and tolerance to Genvoya. Viral load was 53 with CD4 count 446. RPR non-reactive.  Renal function, hepatic function and electrolytes within normal ranges. Lipid profile with triglycerides 44, LDL 73 and HDL 70 (ASCVD risk 4.66%). Here today for follow up.    No Known Allergies    Outpatient Medications Prior to Visit  Medication Sig Dispense Refill   divalproex (DEPAKOTE ER) 500 MG 24 hr tablet Take 2 tablets (1,000 mg total) by mouth daily. NO FURTHER REFILLS - FOLLOW UP WITH NEUROLOGY 60 tablet 0   elvitegravir-cobicistat-emtricitabine-tenofovir (GENVOYA) 150-150-200-10 MG TABS tablet Take 1 tablet by mouth daily. 30 tablet 0   Ibuprofen (ADVIL PO) Take by mouth.     mometasone (ELOCON) 0.1 % lotion Apply topically daily. To affected area to your legs 60 mL 3   No facility-administered medications prior to visit.     Past Medical History:  Diagnosis Date   HIV (human immunodeficiency virus infection) (HCC)    Seizure disorder, secondary (HCC)      No past surgical history on file.    Review of Systems  Constitutional:  Negative for appetite change, chills, fatigue, fever and unexpected weight change.  Eyes:  Negative for visual disturbance.  Respiratory:  Negative for cough, chest tightness, shortness of breath and wheezing.   Cardiovascular:  Negative for chest pain and leg swelling.  Gastrointestinal:  Negative for abdominal pain, constipation, diarrhea,  nausea and vomiting.  Genitourinary:  Negative for dysuria, flank pain, frequency, genital sores, hematuria and urgency.  Skin:  Negative for rash.  Allergic/Immunologic: Negative for immunocompromised state.  Neurological:  Negative for dizziness and headaches.      Objective:    There were no vitals taken for this visit. Nursing note and vital signs reviewed.  Physical Exam Constitutional:      General: He is not in acute distress.    Appearance: He is well-developed.  Eyes:     Conjunctiva/sclera: Conjunctivae normal.  Cardiovascular:     Rate and Rhythm: Normal rate and regular rhythm.     Heart sounds: Normal heart sounds. No murmur heard.    No friction rub. No gallop.  Pulmonary:     Effort: Pulmonary effort is normal. No respiratory distress.     Breath sounds: Normal breath sounds. No wheezing or rales.  Chest:     Chest wall: No tenderness.  Abdominal:     General: Bowel sounds are normal.     Palpations: Abdomen is soft.     Tenderness: There is no abdominal tenderness.  Musculoskeletal:     Cervical back: Neck supple.  Lymphadenopathy:     Cervical: No cervical adenopathy.  Skin:    General: Skin is warm and dry.     Findings: No rash.  Neurological:     Mental Status: He is alert and oriented to person, place, and time.  Psychiatric:        Behavior: Behavior normal.  Thought Content: Thought content normal.        Judgment: Judgment normal.         08/30/2021   10:33 AM 10/21/2020   11:18 AM 09/16/2019   11:31 AM 05/08/2019   10:59 AM 02/13/2019   11:35 AM  Depression screen PHQ 2/9  Decreased Interest 0 0 0 0 0  Down, Depressed, Hopeless 0 0 0 0 0  PHQ - 2 Score 0 0 0 0 0       Assessment & Plan:    Patient Active Problem List   Diagnosis Date Noted   Arthralgia of left temporomandibular joint 09/16/2019   Weight loss 02/13/2019   Healthcare maintenance 02/13/2019   HIV disease (HCC) 10/21/2015   Seizures (HCC) 10/21/2015   GERD  (gastroesophageal reflux disease) 10/21/2015     Problem List Items Addressed This Visit   None    I am having Mitchell Leonard maintain his Ibuprofen (ADVIL PO), mometasone, divalproex, and Genvoya.   No orders of the defined types were placed in this encounter.    Follow-up: No follow-ups on file.   Marcos Eke, MSN, FNP-C Nurse Practitioner [facility] Main number: [phone]

## 2022-10-09 ENCOUNTER — Ambulatory Visit: Payer: Managed Care, Other (non HMO) | Admitting: Family

## 2022-10-10 ENCOUNTER — Telehealth: Payer: Self-pay

## 2022-10-10 NOTE — Telephone Encounter (Signed)
Patient came in to reschedule missed appointment with [facility] for 11/17/22, will need medication refill on both Depakote and Genvoya to the same pharmacy. Requested a call once refills have been sent

## 2022-10-10 NOTE — Telephone Encounter (Signed)
I spoke to the patient advised him that he needs to be seen sooner in the office to get refills and will have to seen another provider due to Tammy Sours being out of the office. Patient schedule with Manandhar on 10/12/22. Patient also advised to reach out to his neurologist for Depakote refills

## 2022-10-12 ENCOUNTER — Other Ambulatory Visit: Payer: Self-pay

## 2022-10-12 ENCOUNTER — Other Ambulatory Visit (HOSPITAL_COMMUNITY)
Admission: RE | Admit: 2022-10-12 | Discharge: 2022-10-12 | Disposition: A | Discharge status: Home / Self Care | Payer: Managed Care, Other (non HMO) | Source: Ambulatory Visit | Attending: Family | Admitting: Family

## 2022-10-12 ENCOUNTER — Encounter: Payer: Self-pay | Admitting: Infectious Diseases

## 2022-10-12 ENCOUNTER — Ambulatory Visit (INDEPENDENT_AMBULATORY_CARE_PROVIDER_SITE_OTHER): Payer: Managed Care, Other (non HMO) | Admitting: Infectious Diseases

## 2022-10-12 VITALS — BP 113/73 | HR 83 | Temp 98.3°F | Wt 136.0 lb

## 2022-10-12 DIAGNOSIS — B20 Human immunodeficiency virus [HIV] disease: Secondary | ICD-10-CM

## 2022-10-12 DIAGNOSIS — Z113 Encounter for screening for infections with a predominantly sexual mode of transmission: Secondary | ICD-10-CM | POA: Insufficient documentation

## 2022-10-12 DIAGNOSIS — Z7185 Encounter for immunization safety counseling: Secondary | ICD-10-CM

## 2022-10-12 DIAGNOSIS — Z79899 Other long term (current) drug therapy: Secondary | ICD-10-CM

## 2022-10-12 DIAGNOSIS — Z0000 Encounter for general adult medical examination without abnormal findings: Secondary | ICD-10-CM

## 2022-10-12 MED ORDER — GENVOYA 150-150-200-10 MG PO TABS: 1.0000 | ORAL_TABLET | Freq: Every day | ORAL | 11 refills | Status: DC

## 2022-10-12 MED ORDER — MOMETASONE FUROATE 0.1 % EX SOLN: Freq: Every day | CUTANEOUS | 0 refills | Status: DC

## 2022-10-12 NOTE — Patient Instructions (Signed)
Patient wants to research about [employer]

## 2022-10-12 NOTE — Progress Notes (Addendum)
[facility], [place], [place], [address]                                                                  Phn. [phone]; Fax: [phone]                                                                             Date: 10/12/22  Reason for Visit: Routine HIV care.  HPI: Mitchell Leonard is a 51 y.o.old male with a history of HIV followed previously by Dr Baxter Flattery and lately NP Terri Piedra and seizure d/o on meds, SARSCov2 in 2022  who is here for refills of genvoya as he missed his last appt. Patient moved to [place] from [place] several years ago and seems to be on [place] equivalent prior to switch to [place] in 09/2015. He has been tolerating it well without any issues. He is also taking divalproex for seizure d/o and follows Neurology   Last seen on 08/30/2021.  Lab Results  Component Value Date   HIV1RNAQUANT 53 (H) 08/30/2021   Lab Results  Component Value Date   CD4TABS 446 08/30/2021   CD4TABS 489 10/21/2020   CD4TABS 445 09/16/2019    Interval hx/current visit: Taking Genvoya daily, denies missing doses. Denies barriers to adherence of tx. Got refill for depakote on 10/10/22 and also scheduled to see Neurologist.  Sexually active with wife only  has a 63 year old daughter.  Works in a Sales executive in [employer] flu vaccine and covid vaccines Discussed dental care at [facility] [employer] Denies any other complaints today  ROS: As stated in above HPI; all other systems were reviewed and are otherwise negative unless noted below  No reported fever / chills, night sweats, unintentional weight loss, acute visual change, odynophagia, chest pain/pressure, new or worsened SOB or WOB, nausea, vomiting, diarrhea, dysuria, GU discharge, syncope, seizures, red/hot swollen joints,  hallucinations / delusions, rashes, new allergies, unusual / excessive bleeding, swollen lymph nodes, or new hospitalizations/ED visits/Urgent Care visits since the pt was last seen.  PMH/ PSH/ FamHx / Social Hx , medications and allergies reviewed and updated as appropriate; please see corresponding tab in EHR / prior notes                                        Current Outpatient Medications on File Prior to Visit  Medication Sig Dispense Refill   divalproex (DEPAKOTE ER) 500 MG 24 hr tablet Take 2 tablets (1,000 mg total) by mouth daily. NO FURTHER REFILLS - FOLLOW UP  WITH NEUROLOGY 60 tablet 0   Ibuprofen (ADVIL PO) Take by mouth.     mometasone (ELOCON) 0.1 % lotion Apply topically daily. To affected area to your legs 60 mL 3   No current facility-administered medications on file prior to visit.   No Known Allergies  Past Medical History:  Diagnosis Date   HIV (human immunodeficiency virus infection) ([place])    Seizure disorder, secondary ([place])    No past surgical history on file.  Social History   Socioeconomic History   Marital status: Married    Spouse name: Not on file   Number of children: Not on file   Years of education: Not on file   Highest education level: Not on file  Occupational History   Not on file  Tobacco Use   Smoking status: Never   Smokeless tobacco: Never  Vaping Use   Vaping Use: Never used  Substance and Sexual Activity   Alcohol use: Yes    Alcohol/week: 0.0 standard drinks of alcohol    Comment: socially    Drug use: No   Sexual activity: Yes    Partners: Female    Comment: pt declined condoms  Other Topics Concern   Not on file  Social History Narrative   Not on file   Social Determinants of Health   Financial Resource Strain: Not on file  Food Insecurity: Not on file  Transportation Needs: Not on file  Physical Activity: Not on file  Stress: Not on file  Social Connections: Not on file  Intimate Partner Violence: Not on file    No family history on file.  Vitals  BP 113/73   Pulse 83   Temp 98.3 F (36.8 C) (Oral)   Wt 136 lb (61.7 kg)   SpO2 100%   BMI 21.95 kg/m   Examination  Gen: no acute distress HEENT: [place]/AT, no scleral icterus, no pale conjunctivae, hearing normal, oral mucosa moist Neck: Supple Cardio: Regular rate and rhythm Resp: Pulmonary effort normal in room air GI: nondistended GU: Musc: Extremities: No cyanosis, clubbing, or edema Skin: No rashes, lesions, or ecchymoses Neuro: grossly non focal , awake, alert and oriented * 3  Psych: Calm, cooperative    Lab Results HIV 1 RNA Quant  Date Value  08/30/2021 53 Copies/mL (H)  10/21/2020 <20 Copies/mL  09/16/2019 <20 NOT DETECTED copies/mL   CD4 T Cell Abs (/uL)  Date Value  08/30/2021 446  10/21/2020 489  09/16/2019 445   No results found for: "HIV1GENOSEQ" Lab Results  Component Value Date   WBC 6.7 08/30/2021   HGB 15.2 08/30/2021   HCT 44.1 08/30/2021   MCV 96.5 08/30/2021   PLT 151 08/30/2021    Lab Results  Component Value Date   CREATININE 0.80 08/30/2021   BUN 15 08/30/2021   NA 141 08/30/2021   K 5.1 08/30/2021   CL 105 08/30/2021   CO2 26 08/30/2021   Lab Results  Component Value Date   ALT 11 08/30/2021   AST 19 08/30/2021   ALKPHOS 53 04/23/2020   BILITOT 0.3 08/30/2021    Lab Results  Component Value Date   CHOL 156 08/30/2021   TRIG 44 08/30/2021   HDL 70 08/30/2021   LDLCALC 73 08/30/2021   Lab Results  Component Value Date   HAV REACTIVE (A) 11/02/2015   Lab Results  Component Value Date   HEPBSAG NEGATIVE 11/02/2015   HEPBSAB POS (A) 11/02/2015   Lab Results  Component Value Date  HCVAB NEGATIVE 11/02/2015   Lab Results  Component Value Date   CHLAMYDIAWP Negative 09/16/2019   N Negative 09/16/2019   No results found for: "GCPROBEAPT" Lab Results  Component Value Date   QUANTGOLD NEGATIVE 09/23/2015    Health Maintenance: Immunization History  Administered  Date(s) Administered   IPV 01/08/2015   Influenza,inj,Quad PF,6+ Mos 11/16/2015, 09/05/2016   Influenza-Unspecified 07/17/2017   MMR 01/08/2015   Meningococcal Mcv4o 12/07/2016, 05/08/2019   PFIZER Comirnaty(Gray Top)Covid-19 Tri-Sucrose Vaccine 02/11/2020, 03/04/2020   PPD Test 09/23/2015   Pneumococcal Conjugate-13 03/05/2018   Pneumococcal Polysaccharide-23 09/23/2015   Tdap 01/08/2015   Microbiology  Results for orders placed or performed during the hospital encounter of 08/08/21  Resp Panel by RT-PCR (Flu A&B, Covid) Nasopharyngeal Swab     Status: Abnormal   Collection Time: 08/08/21 12:52 AM   Specimen: Nasopharyngeal Swab; Nasopharyngeal(NP) swabs in vial transport medium  Result Value Ref Range Status   SARS Coronavirus 2 by RT PCR POSITIVE (A) NEGATIVE Final    Comment: RESULT CALLED TO, READ BACK BY AND VERIFIED WITH: MEGAN JONES AT 0214 08/08/21.PMF (NOTE) SARS-CoV-2 target nucleic acids are DETECTED.  The SARS-CoV-2 RNA is generally detectable in upper respiratory specimens during the acute phase of infection. Positive results are indicative of the presence of the identified virus, but do not rule out bacterial infection or co-infection with other pathogens not detected by the test. Clinical correlation with patient history and other diagnostic information is necessary to determine patient infection status. The expected result is Negative.  Fact Sheet for Patients: EntrepreneurPulse.com.au  Fact Sheet for Healthcare Providers: IncredibleEmployment.be  This test is not yet approved or cleared by the [place] FDA and  has been authorized for detection and/or diagnosis of SARS-CoV-2 by FDA under an Emergency Use Authorization (EUA).  This EUA will remain in effect (meaning this test can be  used) for the duration of  the COVID-19 declaration under Section 564(b)(1) of the Act, 21 U.S.C. section 360bbb-3(b)(1), unless the  authorization is terminated or revoked sooner.     Influenza A by PCR NEGATIVE NEGATIVE Final   Influenza B by PCR NEGATIVE NEGATIVE Final    Comment: (NOTE) The Xpert Xpress SARS-CoV-2/FLU/RSV plus assay is intended as an aid in the diagnosis of influenza from Nasopharyngeal swab specimens and should not be used as a sole basis for treatment. Nasal washings and aspirates are unacceptable for Xpert Xpress SARS-CoV-2/FLU/RSV testing.  Fact Sheet for Patients: EntrepreneurPulse.com.au  Fact Sheet for Healthcare Providers: IncredibleEmployment.be  This test is not yet approved or cleared by the [place] FDA and has been authorized for detection and/or diagnosis of SARS-CoV-2 by FDA under an Emergency Use Authorization (EUA). This EUA will remain in effect (meaning this test can be used) for the duration of the COVID-19 declaration under Section 564(b)(1) of the Act, 21 U.S.C. section 360bbb-3(b)(1), unless the authorization is terminated or revoked.  Performed at [facility], [place], [place] [address]   Group A Strep by PCR [facility] Only)     Status: None   Collection Time: 08/08/21 12:53 AM   Specimen: Nasopharyngeal Swab; Sterile Swab  Result Value Ref Range Status   Group A Strep by PCR NOT DETECTED NOT DETECTED Final    Comment: Performed at [facility], [address], [place], [place] [address]   MRI Brain 03/01/2021 IMPRESSION: Focal 7 mm lesion in the medial left posterior frontal lobe centered in the cortex with full description as above.  Differential diagnosis includes calcified tuber, calcified sequela of neurocysticercosis, thrombosed cavernoma and calcified low-grade brain tumor. Does the patient have older imaging performed elsewhere? If not, I would suggest a head CT to assess for calcification. In any case, this abnormality could serve as a seizure focus.  Assessment/Plan: # HIV in  a heterosexual  - Well controlled - Labs today  - Discussed newer alternatives like Biktarvy, he is not interested and wants to do some research on it.  Jorje Guild refilled  - Fu in 5-6 months   # Seizure d/o of unclear cause, thought to be HIV related - no recent seizure episodes - recently received refill on divaproex on 12/19 by Neurology Dr Manuella Ghazi at [facility] and says have an upcoming appt with them   # STD Screening  - Urine GC and RPR  - No concerns   #Immunization  - Declined vaccines overall   #Health maintenance - Discussed dental care - Discussed screening colonoscopy, he said he will fu with PCP  Patient's labs were reviewed as well as his previous records. Patients questions were addressed and answered. Safe sex counseling done.   I have personally spent 60 minutes involved in face-to-face and non-face-to-face activities for this patient on the day of the visit. Professional time spent includes the following activities: Preparing to see the patient (review of tests), Obtaining and/or reviewing separately obtained history (admission/discharge record), Performing a medically appropriate examination and/or evaluation , Ordering medications/tests/procedures, referring and communicating with other health care professionals, Documenting clinical information in the EMR, Independently interpreting results (not separately reported), Communicating results to the patient/family/caregiver, Counseling and educating the patient/family/caregiver and Care coordination (not separately reported).    Electronically signed by:  Rosiland Oz, MD Infectious Disease Physician [facility] for Infectious Disease [address] [place], [place] [address] Phone: [phone]  Fax: [phone]

## 2022-10-13 LAB — URINE CYTOLOGY ANCILLARY ONLY
Chlamydia: NEGATIVE
Comment: NEGATIVE
Comment: NORMAL
Neisseria Gonorrhea: NEGATIVE

## 2022-10-13 LAB — T-HELPER CELLS (CD4) COUNT (NOT AT ARMC)
CD4 % Helper T Cell: 27 % — ABNORMAL LOW (ref 33–65)
CD4 T Cell Abs: 380 /uL — ABNORMAL LOW (ref 400–1790)

## 2022-10-15 LAB — COMPREHENSIVE METABOLIC PANEL
AG Ratio: 1.3 (calc) (ref 1.0–2.5)
ALT: 11 U/L (ref 9–46)
AST: 16 U/L (ref 10–35)
Albumin: 4.3 g/dL (ref 3.6–5.1)
Alkaline phosphatase (APISO): 59 U/L (ref 35–144)
BUN: 12 mg/dL (ref 7–25)
CO2: 30 mmol/L (ref 20–32)
Calcium: 9.4 mg/dL (ref 8.6–10.3)
Chloride: 102 mmol/L (ref 98–110)
Creat: 0.88 mg/dL (ref 0.70–1.30)
Globulin: 3.2 g/dL (calc) (ref 1.9–3.7)
Glucose, Bld: 73 mg/dL (ref 65–99)
Potassium: 4.9 mmol/L (ref 3.5–5.3)
Sodium: 140 mmol/L (ref 135–146)
Total Bilirubin: 0.3 mg/dL (ref 0.2–1.2)
Total Protein: 7.5 g/dL (ref 6.1–8.1)

## 2022-10-15 LAB — CBC
HCT: 41.6 % (ref 38.5–50.0)
Hemoglobin: 14.3 g/dL (ref 13.2–17.1)
MCH: 32.9 pg (ref 27.0–33.0)
MCHC: 34.4 g/dL (ref 32.0–36.0)
MCV: 95.9 fL (ref 80.0–100.0)
MPV: 12.6 fL — ABNORMAL HIGH (ref 7.5–12.5)
Platelets: 137 10*3/uL — ABNORMAL LOW (ref 140–400)
RBC: 4.34 10*6/uL (ref 4.20–5.80)
RDW: 12.4 % (ref 11.0–15.0)
WBC: 7.7 10*3/uL (ref 3.8–10.8)

## 2022-10-15 LAB — LIPID PANEL
Cholesterol: 133 mg/dL (ref <200)
HDL: 59 mg/dL (ref >=40)
LDL Cholesterol (Calc): 57 mg/dL (calc)
Non-HDL Cholesterol (Calc): 74 mg/dL (calc) (ref <130)
Total CHOL/HDL Ratio: 2.3 (calc) (ref <5.0)
Triglycerides: 89 mg/dL (ref <150)

## 2022-10-15 LAB — HIV RNA, RTPCR W/R GT (RTI, PI,INT)
HIV 1 RNA Quant: 56 copies/mL — ABNORMAL HIGH
HIV-1 RNA Quant, Log: 1.75 Log copies/mL — ABNORMAL HIGH

## 2022-10-15 LAB — RPR: RPR Ser Ql: NONREACTIVE

## 2022-10-18 ENCOUNTER — Telehealth: Payer: Self-pay

## 2022-10-18 NOTE — Telephone Encounter (Signed)
-----   Message from Odette Fraction, MD sent at 10/18/2022  1:49 PM EST ----- Labs noted VL in 50s Recommend to continue ART as discussed

## 2022-11-17 ENCOUNTER — Ambulatory Visit: Payer: Managed Care, Other (non HMO) | Admitting: Family

## 2023-01-20 ENCOUNTER — Other Ambulatory Visit: Payer: Self-pay | Admitting: Infectious Diseases

## 2023-11-07 ENCOUNTER — Encounter: Payer: Self-pay | Admitting: Infectious Diseases

## 2023-11-07 ENCOUNTER — Ambulatory Visit: Payer: Self-pay | Admitting: Infectious Diseases

## 2023-11-07 ENCOUNTER — Other Ambulatory Visit: Payer: Self-pay

## 2023-11-07 VITALS — BP 115/75 | HR 76 | Temp 97.5°F | Wt 131.0 lb

## 2023-11-07 DIAGNOSIS — Z7185 Encounter for immunization safety counseling: Secondary | ICD-10-CM

## 2023-11-07 DIAGNOSIS — Z113 Encounter for screening for infections with a predominantly sexual mode of transmission: Secondary | ICD-10-CM

## 2023-11-07 DIAGNOSIS — Z0000 Encounter for general adult medical examination without abnormal findings: Secondary | ICD-10-CM

## 2023-11-07 DIAGNOSIS — B20 Human immunodeficiency virus [HIV] disease: Secondary | ICD-10-CM

## 2023-11-07 DIAGNOSIS — Z79899 Other long term (current) drug therapy: Secondary | ICD-10-CM

## 2023-11-07 MED ORDER — GENVOYA 150-150-200-10 MG PO TABS: 1.0000 | ORAL_TABLET | Freq: Every day | ORAL | 11 refills | Status: DC

## 2023-11-07 NOTE — Progress Notes (Signed)
[address], [place], [place], [address]                                                                  Phn. [phone]; Fax: [phone]                                                                             Date: 11/07/23  Reason for Visit: Routine HIV care.  HPI: Mitchell Leonard is a 53 y.o.old male with a history of HIV followed previously by Dr Drue Second and seizure d/o on meds, SARSCov2 in 2022  who is here for fu. Patient moved to [place] from [place] several years ago and seems to be on [place] equivalent prior to switch to [place] in 09/2015. He has been tolerating it well without any issues. He is also taking divalproex for seizure d/o and follows Neurology   Last seen on 10/12/22 Lab Results  Component Value Date   HIV1RNAQUANT 56 (H) 10/12/2022   Lab Results  Component Value Date   CD4TABS 380 (L) 10/12/2022   CD4TABS 446 08/30/2021   CD4TABS 489 10/21/2020   Interval hx/current visit: Taking genvoya until 3 days ago after running out of ART. Lost job and insuarnce and needs to see Finance today. He has not seen Neurology yet and needs to see them to get additional refills although has not ran out of meds. Denies smoking, alcohol once in a while. Lives with wife. Declined vaccines including Flu and COVID. He does not have a PCP currently and willing to see new one. No complaints otherwise.    ROS: As stated in above HPI; all other systems were reviewed and are otherwise negative unless noted below  No reported fever / chills, night sweats, unintentional weight loss, acute visual change, odynophagia, chest pain/pressure, new or worsened SOB or WOB, nausea, vomiting, diarrhea, dysuria, GU discharge, syncope, seizures, red/hot swollen joints, hallucinations / delusions, rashes,  new allergies, unusual / excessive bleeding, swollen lymph nodes, or new hospitalizations/ED visits/Urgent Care visits since the pt was last seen.  PMH/ PSH/ FamHx / Social Hx , medications and allergies reviewed and updated as appropriate; please see corresponding tab in EHR / prior notes                                        Current Outpatient Medications on File Prior to Visit  Medication Sig Dispense Refill   divalproex (DEPAKOTE ER) 500 MG 24 hr tablet Take 2 tablets (1,000 mg total) by mouth daily. NO FURTHER REFILLS - FOLLOW  UP WITH NEUROLOGY 60 tablet 0   elvitegravir-cobicistat-emtricitabine-tenofovir (GENVOYA) 150-150-200-10 MG TABS tablet Take 1 tablet by mouth daily. 30 tablet 11   Ibuprofen (ADVIL PO) Take by mouth.     mometasone (ELOCON) 0.1 % lotion Apply topically daily. To affected area to your legs 60 mL 0   No current facility-administered medications on file prior to visit.   No Known Allergies  Past Medical History:  Diagnosis Date   HIV (human immunodeficiency virus infection) ([facility])    Seizure disorder, secondary ([facility])    No past surgical history on file.  Social History   Socioeconomic History   Marital status: Married    Spouse name: Not on file   Number of children: Not on file   Years of education: Not on file   Highest education level: Not on file  Occupational History   Not on file  Tobacco Use   Smoking status: Never   Smokeless tobacco: Never  Vaping Use   Vaping status: Never Used  Substance and Sexual Activity   Alcohol use: Yes    Alcohol/week: 0.0 standard drinks of alcohol    Comment: socially    Drug use: No   Sexual activity: Yes    Partners: Female    Comment: pt declined condoms  Other Topics Concern   Not on file  Social History Narrative   Not on file   Social Drivers of Health   Financial Resource Strain: Not on file  Food Insecurity: Not on file  Transportation Needs: Not on file  Physical Activity: Not on file   Stress: Not on file  Social Connections: Not on file  Intimate Partner Violence: Not on file   No family history on file.  Vitals  BP 115/75   Pulse 76   Temp (!) 97.5 F (36.4 C) (Oral)   Wt 131 lb (59.4 kg)   BMI 21.14 kg/m    Examination  Gen: no acute distress HEENT: [place]/AT, no scleral icterus, no pale conjunctivae, hearing normal, oral mucosa moist Neck: Supple Cardio: Regular rate and rhythm Resp: Pulmonary effort normal in room air GI: nondistended GU: Musc: Extremities: No pedal edema Skin: No rashes Neuro: grossly non focal , awake, alert and oriented * 3  Psych: Calm, cooperative  Lab Results HIV 1 RNA Quant  Date Value  10/12/2022 56 copies/mL (H)  08/30/2021 53 Copies/mL (H)  10/21/2020 <20 Copies/mL   CD4 T Cell Abs (/uL)  Date Value  10/12/2022 380 (L)  08/30/2021 446  10/21/2020 489   No results found for: "HIV1GENOSEQ" Lab Results  Component Value Date   WBC 7.7 10/12/2022   HGB 14.3 10/12/2022   HCT 41.6 10/12/2022   MCV 95.9 10/12/2022   PLT 137 (L) 10/12/2022    Lab Results  Component Value Date   CREATININE 0.88 10/12/2022   BUN 12 10/12/2022   NA 140 10/12/2022   K 4.9 10/12/2022   CL 102 10/12/2022   CO2 30 10/12/2022   Lab Results  Component Value Date   ALT 11 10/12/2022   AST 16 10/12/2022   ALKPHOS 53 04/23/2020   BILITOT 0.3 10/12/2022    Lab Results  Component Value Date   CHOL 133 10/12/2022   TRIG 89 10/12/2022   HDL 59 10/12/2022   LDLCALC 57 10/12/2022   Lab Results  Component Value Date   HAV REACTIVE (A) 11/02/2015   Lab Results  Component Value Date   HEPBSAG NEGATIVE 11/02/2015   HEPBSAB POS (A) 11/02/2015  Lab Results  Component Value Date   HCVAB NEGATIVE 11/02/2015   Lab Results  Component Value Date   CHLAMYDIAWP Negative 10/12/2022   N Negative 10/12/2022   No results found for: "GCPROBEAPT" Lab Results  Component Value Date   QUANTGOLD NEGATIVE 09/23/2015    Health  Maintenance: Immunization History  Administered Date(s) Administered   IPV 01/08/2015   Influenza,inj,Quad PF,6+ Mos 11/16/2015, 09/05/2016   Influenza-Unspecified 07/17/2017   MMR 01/08/2015   Meningococcal Mcv4o 12/07/2016, 05/08/2019   PFIZER Comirnaty(Gray Top)Covid-19 Tri-Sucrose Vaccine 02/11/2020, 03/04/2020   PPD Test 09/23/2015   Pneumococcal Conjugate-13 03/05/2018   Pneumococcal Polysaccharide-23 09/23/2015   Tdap 01/08/2015   Microbiology  Results for orders placed or performed during the hospital encounter of 08/08/21  Resp Panel by RT-PCR (Flu A&B, Covid) Nasopharyngeal Swab     Status: Abnormal   Collection Time: 08/08/21 12:52 AM   Specimen: Nasopharyngeal Swab; Nasopharyngeal(NP) swabs in vial transport medium  Result Value Ref Range Status   SARS Coronavirus 2 by RT PCR POSITIVE (A) NEGATIVE Final    Comment: RESULT CALLED TO, READ BACK BY AND VERIFIED WITH: MEGAN JONES AT 0214 08/08/21.PMF (NOTE) SARS-CoV-2 target nucleic acids are DETECTED.  The SARS-CoV-2 RNA is generally detectable in upper respiratory specimens during the acute phase of infection. Positive results are indicative of the presence of the identified virus, but do not rule out bacterial infection or co-infection with other pathogens not detected by the test. Clinical correlation with patient history and other diagnostic information is necessary to determine patient infection status. The expected result is Negative.  Fact Sheet for Patients: BloggerCourse.com  Fact Sheet for Healthcare Providers: SeriousBroker.it  This test is not yet approved or cleared by the [place] FDA and  has been authorized for detection and/or diagnosis of SARS-CoV-2 by FDA under an Emergency Use Authorization (EUA).  This EUA will remain in effect (meaning this test can be  used) for the duration of  the COVID-19 declaration under Section 564(b)(1) of the Act,  21 U.S.C. section 360bbb-3(b)(1), unless the authorization is terminated or revoked sooner.     Influenza A by PCR NEGATIVE NEGATIVE Final   Influenza B by PCR NEGATIVE NEGATIVE Final    Comment: (NOTE) The Xpert Xpress SARS-CoV-2/FLU/RSV plus assay is intended as an aid in the diagnosis of influenza from Nasopharyngeal swab specimens and should not be used as a sole basis for treatment. Nasal washings and aspirates are unacceptable for Xpert Xpress SARS-CoV-2/FLU/RSV testing.  Fact Sheet for Patients: BloggerCourse.com  Fact Sheet for Healthcare Providers: SeriousBroker.it  This test is not yet approved or cleared by the [place] FDA and has been authorized for detection and/or diagnosis of SARS-CoV-2 by FDA under an Emergency Use Authorization (EUA). This EUA will remain in effect (meaning this test can be used) for the duration of the COVID-19 declaration under Section 564(b)(1) of the Act, 21 U.S.C. section 360bbb-3(b)(1), unless the authorization is terminated or revoked.  Performed at [facility], [address] [place], [place] [address]   Group A Strep by PCR [facility] Only)     Status: None   Collection Time: 08/08/21 12:53 AM   Specimen: Nasopharyngeal Swab; Sterile Swab  Result Value Ref Range Status   Group A Strep by PCR NOT DETECTED NOT DETECTED Final    Comment: Performed at [facility], [address] [place], [place] [address]   MRI Brain 03/01/2021 IMPRESSION: Focal 7 mm lesion in the medial left posterior frontal lobe centered  in the cortex with full description as above. Differential diagnosis includes calcified tuber, calcified sequela of neurocysticercosis, thrombosed cavernoma and calcified low-grade brain tumor. Does the patient have older imaging performed elsewhere? If not, I would suggest a head CT to assess for calcification. In any case, this abnormality could serve as a  seizure focus.  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 Patients questions were addressed and answered. Safe sex counseling done.  I have personally spent 35 minutes involved in face-to-face and non-face-to-face activities for this patient on the day of the visit. Professional time spent includes the following activities: Preparing to see the patient (review of tests), Obtaining and/or reviewing separately obtained history (admission/discharge record), Performing a medically appropriate examination and/or evaluation , Ordering medications/tests/procedures, referring and communicating with other health care professionals, Documenting clinical information in the EMR, Independently interpreting results (not separately reported), Communicating results to the patient/family/caregiver, Counseling and educating the patient/family/caregiver and Care coordination (not separately reported).    Electronically signed by:  Odette Fraction, MD Infectious Disease Physician [facility] for Infectious Disease [address] [place], [place] [address] Phone: [phone]  Fax: [phone]

## 2023-11-09 LAB — URINE CYTOLOGY ANCILLARY ONLY
Chlamydia: NEGATIVE
Comment: NEGATIVE
Comment: NORMAL
Neisseria Gonorrhea: NEGATIVE

## 2023-11-10 LAB — COMPREHENSIVE METABOLIC PANEL
AG Ratio: 1.3 (calc) (ref 1.0–2.5)
ALT: 14 U/L (ref 9–46)
AST: 17 U/L (ref 10–35)
Albumin: 4.5 g/dL (ref 3.6–5.1)
Alkaline phosphatase (APISO): 62 U/L (ref 35–144)
BUN: 12 mg/dL (ref 7–25)
CO2: 31 mmol/L (ref 20–32)
Calcium: 9.6 mg/dL (ref 8.6–10.3)
Chloride: 103 mmol/L (ref 98–110)
Creat: 0.89 mg/dL (ref 0.70–1.30)
Globulin: 3.4 g/dL (ref 1.9–3.7)
Glucose, Bld: 99 mg/dL (ref 65–99)
Potassium: 5.1 mmol/L (ref 3.5–5.3)
Sodium: 143 mmol/L (ref 135–146)
Total Bilirubin: 0.3 mg/dL (ref 0.2–1.2)
Total Protein: 7.9 g/dL (ref 6.1–8.1)

## 2023-11-10 LAB — CBC
HCT: 44 % (ref 38.5–50.0)
Hemoglobin: 14.7 g/dL (ref 13.2–17.1)
MCH: 32.5 pg (ref 27.0–33.0)
MCHC: 33.4 g/dL (ref 32.0–36.0)
MCV: 97.1 fL (ref 80.0–100.0)
MPV: 12.3 fL (ref 7.5–12.5)
Platelets: 156 10*3/uL (ref 140–400)
RBC: 4.53 10*6/uL (ref 4.20–5.80)
RDW: 12 % (ref 11.0–15.0)
WBC: 7.1 10*3/uL (ref 3.8–10.8)

## 2023-11-10 LAB — LIPID PANEL
Cholesterol: 168 mg/dL (ref <200)
HDL: 56 mg/dL (ref >=40)
LDL Cholesterol (Calc): 95 mg/dL
Non-HDL Cholesterol (Calc): 112 mg/dL (ref <130)
Total CHOL/HDL Ratio: 3 (calc) (ref <5.0)
Triglycerides: 81 mg/dL (ref <150)

## 2023-11-10 LAB — HIV RNA, RTPCR W/R GT (RTI, PI,INT)
HIV 1 RNA Quant: NOT DETECTED {copies}/mL
HIV-1 RNA Quant, Log: NOT DETECTED {Log}

## 2023-11-10 LAB — RPR: RPR Ser Ql: NONREACTIVE

## 2023-11-10 LAB — T-HELPER CELLS (CD4) COUNT (NOT AT ARMC)
Absolute CD4: 692 {cells}/uL (ref 490–1740)
CD4 T Helper %: 38 % (ref 30–61)
Total lymphocyte count: 1826 {cells}/uL (ref 850–3900)

## 2023-12-07 ENCOUNTER — Other Ambulatory Visit: Payer: Self-pay

## 2023-12-07 ENCOUNTER — Emergency Department: Payer: Self-pay

## 2023-12-07 ENCOUNTER — Other Ambulatory Visit: Payer: Self-pay | Admitting: Infectious Diseases

## 2023-12-07 ENCOUNTER — Emergency Department
Admission: EM | Admit: 2023-12-07 | Discharge: 2023-12-07 | Disposition: A | Discharge status: Home / Self Care | Payer: Self-pay | Attending: Emergency Medicine | Admitting: Emergency Medicine

## 2023-12-07 DIAGNOSIS — X58XXXA Exposure to other specified factors, initial encounter: Secondary | ICD-10-CM | POA: Insufficient documentation

## 2023-12-07 DIAGNOSIS — Z21 Asymptomatic human immunodeficiency virus [HIV] infection status: Secondary | ICD-10-CM | POA: Insufficient documentation

## 2023-12-07 DIAGNOSIS — S39012A Strain of muscle, fascia and tendon of lower back, initial encounter: Secondary | ICD-10-CM

## 2023-12-07 DIAGNOSIS — B20 Human immunodeficiency virus [HIV] disease: Secondary | ICD-10-CM

## 2023-12-07 LAB — TROPONIN I (HIGH SENSITIVITY): Troponin I (High Sensitivity): 3 ng/L (ref <18)

## 2023-12-07 LAB — CBC
HCT: 44.3 % (ref 39.0–52.0)
Hemoglobin: 15.1 g/dL (ref 13.0–17.0)
MCH: 31.9 pg (ref 26.0–34.0)
MCHC: 34.1 g/dL (ref 30.0–36.0)
MCV: 93.7 fL (ref 80.0–100.0)
Platelets: 172 10*3/uL (ref 150–400)
RBC: 4.73 MIL/uL (ref 4.22–5.81)
RDW: 12.2 % (ref 11.5–15.5)
WBC: 6.8 10*3/uL (ref 4.0–10.5)
nRBC: 0 % (ref 0.0–0.2)

## 2023-12-07 LAB — BASIC METABOLIC PANEL
Anion gap: 12 (ref 5–15)
BUN: 22 mg/dL — ABNORMAL HIGH (ref 6–20)
CO2: 25 mmol/L (ref 22–32)
Calcium: 9.2 mg/dL (ref 8.9–10.3)
Chloride: 100 mmol/L (ref 98–111)
Creatinine, Ser: 0.91 mg/dL (ref 0.61–1.24)
GFR, Estimated: >60 mL/min (ref >60)
Glucose, Bld: 102 mg/dL — ABNORMAL HIGH (ref 70–99)
Potassium: 4.4 mmol/L (ref 3.5–5.1)
Sodium: 137 mmol/L (ref 135–145)

## 2023-12-07 MED ORDER — GENVOYA 150-150-200-10 MG PO TABS: 1.0000 | ORAL_TABLET | Freq: Every day | ORAL | 5 refills | Status: DC

## 2023-12-07 MED ORDER — MELOXICAM 15 MG PO TABS: 15.0000 mg | ORAL_TABLET | Freq: Every day | ORAL | 0 refills | Status: DC

## 2023-12-07 MED ORDER — DIVALPROEX SODIUM ER 500 MG PO TB24: 500.0000 mg | ORAL_TABLET | Freq: Every day | ORAL | 0 refills | Status: DC

## 2023-12-07 MED ORDER — GENVOYA 150-150-200-10 MG PO TABS: 1.0000 | ORAL_TABLET | Freq: Every day | ORAL | 0 refills | Status: DC

## 2023-12-07 MED ORDER — MELOXICAM 15 MG PO TABS
15.0000 mg | ORAL_TABLET | Freq: Every day | ORAL | 0 refills | Status: AC
Start: 2023-12-07 — End: 2023-12-14

## 2023-12-07 MED ORDER — HYDROCODONE-ACETAMINOPHEN 5-325 MG PO TABS
1.0000 | ORAL_TABLET | Freq: Once | ORAL | Status: AC
  Administered 2023-12-07: 1 via ORAL
  Filled 2023-12-07: qty 1

## 2023-12-07 NOTE — ED Provider Notes (Signed)
[facility] Provider Note    Event Date/Time   First MD Initiated Contact with Patient 12/07/23 1636     (approximate)   History   Back Pain   HPI  Mitchell Leonard is a 53 y.o. male who presents today with history of lower back pain, patient states he has history of seizures, HIV.  Patient does not have seizure and medications, patient  does not have insurance.     Physical Exam   Triage Vital Signs: ED Triage Vitals [12/07/23 1414]  Encounter Vitals Group     BP 115/84     Systolic BP Percentile      Diastolic BP Percentile      Pulse Rate 83     Resp 16     Temp 98.1 F (36.7 C)     Temp Source Oral     SpO2 99 %     Weight      Height      Head Circumference      Peak Flow      Pain Score 5     Pain Loc      Pain Education      Exclude from Growth Chart     Most recent vital signs: Vitals:   12/07/23 1414  BP: 115/84  Pulse: 83  Resp: 16  Temp: 98.1 F (36.7 C)  SpO2: 99%     Constitutional: Alert no distress Eyes: Conjunctivae are normal.  Head: Atraumatic. Nose: No congestion/rhinnorhea. Mouth/Throat: Mucous membranes are moist.   Neck: Painless ROM.  Cardiovascular:   Good peripheral circulation.  Regular rhythm Respiratory: Normal respiratory effort.  No retractions.  No wheezing Gastrointestinal: Soft and nontender.  Musculoskeletal:  no deformity.  Lower back L5 tender to palpation in paraspinal muscles Neurologic:  MAE spontaneously. No gross focal neurologic deficits are appreciated.  Skin:  Skin is warm, dry and intact. No rash noted. Psychiatric: Mood and affect are normal. Speech and behavior are normal.    ED Results / Procedures / Treatments   Labs (all labs ordered are listed, but only abnormal results are displayed) Labs Reviewed  BASIC METABOLIC PANEL - Abnormal; Notable for the following components:      Result Value   Glucose, Bld 102 (*)    BUN 22 (*)    All other components within normal  limits  CBC  TROPONIN I (HIGH SENSITIVITY)  TROPONIN I (HIGH SENSITIVITY)     EKG See physician read    RADIOLOGY I independently reviewed and interpreted imaging and agree with radiologists findings.      PROCEDURES:  Critical Care performed:   Procedures   MEDICATIONS ORDERED IN ED: Medications  HYDROcodone-acetaminophen (NORCO/VICODIN) 5-325 MG per tablet 1 tablet (has no administration in time range)     IMPRESSION / MDM / ASSESSMENT AND PLAN / ED COURSE  I reviewed the triage vital signs and the nursing notes.  Differential diagnosis includes, but is not limited to, muscle strain, radiculopathy, fracture  Patient's presentation is most consistent with acute complicated illness / injury requiring diagnostic workup.   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 I independently reviewed and interpreted imaging and agree with radiologists findings. Labs are rea reassuring. I did review the patient's allergies and medications. Patient will be discharged home with prescriptions for meloxicam Depakote. Patient is to follow up with PCP as needed or otherwise directed. Patient is given ED precautions to return to the ED for any worsening or new  symptoms. Discussed plan of care with patient, answered all of patient's questions, Patient agreeable to plan of care. Advised patient to take medications according to the instructions on the label. Discussed possible side effects of new medications. Patient verbalized understanding. Clinical Course as of 12/07/23 1730  Fri Dec 07, 2023  1637 Troponin I (High Sensitivity) Within normal limits [AE]  1639 Troponin I (High Sensitivity) Within normal limits [AE]  1639 Basic metabolic panel(!) Electrolytes within normal limits [AE]  1639 CBC Within normal limits [AE]  1720 CT Lumbar Spine Wo Contrast Minor lumbar spondylosis without evidence of compressive stenosis or acute osseous abnormality.    [AE]  1720 DG Chest 2 View No active cardiopulmonary disease. [AE]  1721 CT Cervical Spine Wo Contrast Minor cervical spondylosis without evidence of high-grade stenosis or acute osseous abnormality. 2.  Emphysema (ICD10-J43.9).   [AE]    Clinical Course User Index [AE] Gladys Damme, PA-C     FINAL CLINICAL IMPRESSION(S) / ED DIAGNOSES   Final diagnoses:  Strain of lumbar region, initial encounter     Rx / DC Orders   ED Discharge Orders          Ordered    divalproex (DEPAKOTE ER) 500 MG 24 hr tablet  Daily        12/07/23 1725    meloxicam (MOBIC) 15 MG tablet  Daily        12/07/23 1725             Note:  This document was prepared using Dragon voice recognition software and may include unintentional dictation errors.   Gladys Damme, PA-C 12/07/23 1730    Minna Antis, MD 12/07/23 2242

## 2023-12-07 NOTE — Discharge Instructions (Addendum)
Diagnosed with minor lumbar spondylolysis.  Please take meloxicam 1 tablet with breakfast for lumbar pain.  He states the back 1 tablet every 24 hours to prevent seizures.  Please use t GoodRx app to buy your medicines.  Please come back to ED or go to your PCP if you have new symptoms or symptoms worsen

## 2023-12-07 NOTE — ED Provider Triage Note (Signed)
Emergency Medicine Provider Triage Evaluation Note  Yuvin Baumgartner , a 53 y.o. male  was evaluated in triage.  Pt complains of about 2 months of low back pain.  Nothing seems to make it better or worse.  Denies any extremity numbness or weakness.  No bowel or bladder dysfunction.  Does note that he has been losing strength of his right arm for several months or years..  Review of Systems  Positive:  Negative:   Physical Exam  BP 115/84 (BP Location: Left Arm)   Pulse 83   Temp 98.1 F (36.7 C) (Oral)   Resp 16   SpO2 99%  Gen:   Awake, no distress   Resp:  Normal effort  MSK:   Moves extremities without difficulty  Other:    Medical Decision Making  Medically screening exam initiated at 2:38 PM.  Appropriate orders placed.  Jaron Luba was informed that the remainder of the evaluation will be completed by another provider, this initial triage assessment does not replace that evaluation, and the importance of remaining in the ED until their evaluation is complete.  Back pain workup   Christen Bame, PA-C 12/07/23 1440

## 2023-12-07 NOTE — ED Notes (Signed)
Patient discharged from ED by provider. Discharge instructions reviewed with patient and all questions answered. Patient ambulatory from ED in NAD.

## 2023-12-07 NOTE — ED Triage Notes (Addendum)
Pt c/o right arm and leg weakness that he says he always has on and off, and reports dizziness and middle back pain for a month that has gotten worse. Pt is AOX4, denies numbness, CMS is intact. Pt denies urinary s/s. Pt states he's out of seizure medication since yesterday.

## 2023-12-10 ENCOUNTER — Other Ambulatory Visit (HOSPITAL_COMMUNITY): Payer: Self-pay

## 2023-12-10 ENCOUNTER — Telehealth: Payer: Self-pay

## 2023-12-10 NOTE — Telephone Encounter (Signed)
 [facility] Patient Advocate Encounter ? ?Insurance verification completed.   ? ?The patient is uninsured and will need patient assistance for medication. ? ?We can complete the application and will need to meet with the patient for signatures and income documentation. ? ?Clearance Coots, CPhT ?Specialty Pharmacy Patient Advocate ?[facility] ?Phone: [phone] ?Fax:  [phone]  ?

## 2024-03-11 NOTE — Progress Notes (Signed)
 The 10-year ASCVD risk score (Arnett DK, et al., 2019) is: 4.2%   Values used to calculate the score:     Age: 53 years     Sex: Male     Is Non-Hispanic African American: Yes     Diabetic: No     Tobacco smoker: No     Systolic Blood Pressure: 115 mmHg     Is BP treated: No     HDL Cholesterol: 56 mg/dL     Total Cholesterol: 168 mg/dL  ASCVD < 5%  Arlon Bergamo, BSN, Charity fundraiser

## 2024-03-31 ENCOUNTER — Ambulatory Visit (INDEPENDENT_AMBULATORY_CARE_PROVIDER_SITE_OTHER): Payer: Self-pay | Admitting: Infectious Diseases

## 2024-03-31 ENCOUNTER — Encounter: Payer: Self-pay | Admitting: Infectious Diseases

## 2024-03-31 ENCOUNTER — Other Ambulatory Visit: Payer: Self-pay

## 2024-03-31 VITALS — BP 114/74 | HR 80 | Temp 98.0°F | Ht 66.0 in | Wt 129.0 lb

## 2024-03-31 DIAGNOSIS — Z113 Encounter for screening for infections with a predominantly sexual mode of transmission: Secondary | ICD-10-CM

## 2024-03-31 DIAGNOSIS — Z0000 Encounter for general adult medical examination without abnormal findings: Secondary | ICD-10-CM

## 2024-03-31 DIAGNOSIS — R569 Unspecified convulsions: Secondary | ICD-10-CM

## 2024-03-31 DIAGNOSIS — B20 Human immunodeficiency virus [HIV] disease: Secondary | ICD-10-CM

## 2024-03-31 DIAGNOSIS — Z7185 Encounter for immunization safety counseling: Secondary | ICD-10-CM

## 2024-03-31 DIAGNOSIS — Z79899 Other long term (current) drug therapy: Secondary | ICD-10-CM

## 2024-03-31 MED ORDER — GENVOYA 150-150-200-10 MG PO TABS: 1.0000 | ORAL_TABLET | Freq: Every day | ORAL | 0 refills | Status: DC

## 2024-03-31 MED ORDER — MOMETASONE FUROATE 0.1 % EX SOLN: Freq: Every day | CUTANEOUS | 0 refills | Status: DC

## 2024-03-31 MED ORDER — DIVALPROEX SODIUM ER 500 MG PO TB24: 500.0000 mg | ORAL_TABLET | Freq: Every day | ORAL | 0 refills | Status: DC

## 2024-03-31 NOTE — Progress Notes (Unsigned)
 [address], [place], [place], [address]                                                                  Phn. [phone]; Fax: [phone]                                                                             Date: 03/31/24  Reason for Visit: Routine HIV care.  HPI: Mitchell Leonard is a 53 y.o.old male with a history of HIV followed previously by Dr Levern Reader and seizure d/o on meds, SARSCov2 in 2022  who is here for fu. [place] moved to [place]  from [place] several years ago and seems to be on atripla equivalent prior to switch to Genvoya  in 09/2015. He has been tolerating it well without any issues. He is also taking divalproex  for seizure d/o and follows Neurology   6/9 Taking genvoya  until 3 days ago after running out of it. Reports losing job and insurance and needs to see Finance today. Denies smoking, alcohol once in a while. Lives with wife. He does not have a PCP currently and willing to see new one. Some pain in teeth and wants to see dentist. Denies being sexually active. Lives with wife and daughter. Asking for refill for divalproex  and cream. He has not seen Neurology yet. Declined vaccines. No complaints otherwise.      ROS: As stated in above HPI; all other systems were reviewed and are otherwise negative unless noted below  No reported fever / chills, night sweats, unintentional weight loss, acute visual change, odynophagia, chest pain/pressure, new or worsened SOB or WOB, nausea, vomiting, diarrhea, dysuria, GU discharge, syncope, seizures, red/hot swollen joints, hallucinations / delusions, rashes, new allergies, unusual / excessive bleeding, swollen lymph nodes, or new hospitalizations since the pt was last seen.  PMH/ PSH/ FamHx / Social Hx , medications and allergies reviewed  and updated as appropriate; please see corresponding tab in EHR / prior notes                                        Current Outpatient Medications on File Prior to Visit  Medication Sig Dispense Refill   divalproex  (DEPAKOTE  ER) 500 MG 24 hr tablet Take 1 tablet (500 mg total) by mouth daily. 90 tablet 0   elvitegravir-cobicistat-emtricitabine-tenofovir (GENVOYA ) 150-150-200-10 MG TABS tablet Take 1 tablet by mouth daily with breakfast. 30 tablet 5   elvitegravir-cobicistat-emtricitabine-tenofovir (GENVOYA ) 150-150-200-10 MG TABS tablet Take 1 tablet by mouth daily with breakfast. 30 tablet 0   mometasone  (  ELOCON ) 0.1 % lotion Apply topically daily. To affected area to your legs (Patient not taking: Reported on 11/07/2023) 60 mL 0   No current facility-administered medications on file prior to visit.   No Known Allergies  Past Medical History:  Diagnosis Date   HIV (human immunodeficiency virus infection) (HCC)    Seizure disorder, secondary (HCC)    No past surgical history on file.  Social History   Socioeconomic History   Marital status: Married    Spouse name: Not on file   Number of children: Not on file   Years of education: Not on file   Highest education level: Not on file  Occupational History   Not on file  Tobacco Use   Smoking status: Never   Smokeless tobacco: Never  Vaping Use   Vaping status: Never Used  Substance and Sexual Activity   Alcohol use: Yes    Alcohol/week: 0.0 standard drinks of alcohol    Comment: socially    Drug use: No   Sexual activity: Yes    Partners: Female    Comment: pt declined condoms  Other Topics Concern   Not on file  Social History Narrative   Not on file   Social Drivers of Health   Financial Resource Strain: Not on file  Food Insecurity: Not on file  Transportation Needs: Not on file  Physical Activity: Not on file  Stress: Not on file  Social Connections: Not on file  Intimate Partner Violence: Not on file   No  family history on file.  Vitals  BP 114/74   Pulse 80   Temp 98 F (36.7 C) (Temporal)   Ht 5' 6 (1.676 m)   Wt 129 lb (58.5 kg)   SpO2 98%   BMI 20.82 kg/m   Examination  Gen: no acute distress HEENT: [place]/AT, no scleral icterus, no pale conjunctivae, hearing normal, oral mucosa moist Neck: Supple Cardio: Regular rate and rhythm Resp: Pulmonary effort normal in room air GI: nondistended GU: Musc: Extremities: No pedal edema Skin: No rashes Neuro: grossly non focal , awake, alert and oriented * 3  Psych: Calm, cooperative  Lab Results HIV 1 RNA Quant  Date Value  11/07/2023 NOT DETECTED copies/mL  10/12/2022 56 copies/mL (H)  08/30/2021 53 Copies/mL (H)   CD4 T Cell Abs (/uL)  Date Value  10/12/2022 380 (L)  08/30/2021 446  10/21/2020 489   No results found for: HIV1GENOSEQ Lab Results  Component Value Date   WBC 6.8 12/07/2023   HGB 15.1 12/07/2023   HCT 44.3 12/07/2023   MCV 93.7 12/07/2023   PLT 172 12/07/2023    Lab Results  Component Value Date   CREATININE 0.91 12/07/2023   BUN 22 (H) 12/07/2023   NA 137 12/07/2023   K 4.4 12/07/2023   CL 100 12/07/2023   CO2 25 12/07/2023   Lab Results  Component Value Date   ALT 14 11/07/2023   AST 17 11/07/2023   ALKPHOS 53 04/23/2020   BILITOT 0.3 11/07/2023    Lab Results  Component Value Date   CHOL 168 11/07/2023   TRIG 81 11/07/2023   HDL 56 11/07/2023   LDLCALC 95 11/07/2023   Lab Results  Component Value Date   HAV REACTIVE (A) 11/02/2015   Lab Results  Component Value Date   HEPBSAG NEGATIVE 11/02/2015   HEPBSAB POS (A) 11/02/2015   Lab Results  Component Value Date   HCVAB NEGATIVE 11/02/2015   Lab Results  Component Value  Date   CHLAMYDIAWP Negative 11/07/2023   N Negative 11/07/2023   No results found for: GCPROBEAPT Lab Results  Component Value Date   QUANTGOLD NEGATIVE 09/23/2015    Health Maintenance: Immunization History  Administered Date(s) Administered    IPV 01/08/2015   Influenza,inj,Quad PF,6+ Mos 11/16/2015, 09/05/2016   Influenza-Unspecified 07/17/2017   MMR 01/08/2015   Meningococcal Mcv4o 12/07/2016, 05/08/2019   PFIZER Comirnaty(Gray Top)Covid-19 Tri-Sucrose Vaccine 02/11/2020, 03/04/2020   PPD Test 09/23/2015   Pneumococcal Conjugate-13 03/05/2018   Pneumococcal Polysaccharide-23 09/23/2015   Tdap 01/08/2015   Microbiology  Results for orders placed or performed during the hospital encounter of 08/08/21  Resp Panel by RT-PCR (Flu A&B, Covid) Nasopharyngeal Swab     Status: Abnormal   Collection Time: 08/08/21 12:52 AM   Specimen: Nasopharyngeal Swab; Nasopharyngeal(NP) swabs in vial transport medium  Result Value Ref Range Status   SARS Coronavirus 2 by RT PCR POSITIVE (A) NEGATIVE Final    Comment: RESULT CALLED TO, READ BACK BY AND VERIFIED WITH: MEGAN JONES AT 0214 08/08/21.PMF (NOTE) SARS-CoV-2 target nucleic acids are DETECTED.  The SARS-CoV-2 RNA is generally detectable in upper respiratory specimens during the acute phase of infection. Positive results are indicative of the presence of the identified virus, but do not rule out bacterial infection or co-infection with other pathogens not detected by the test. Clinical correlation with patient history and other diagnostic information is necessary to determine patient infection status. The expected result is Negative.  Fact Sheet for Patients: BloggerCourse.com  Fact Sheet for Healthcare Providers: SeriousBroker.it  This test is not yet approved or cleared by the [place]  FDA and  has been authorized for detection and/or diagnosis of SARS-CoV-2 by FDA under an Emergency Use Authorization (EUA).  This EUA will remain in effect (meaning this test can be  used) for the duration of  the COVID-19 declaration under Section 564(b)(1) of the Act, 21 U.S.C. section 360bbb-3(b)(1), unless the authorization  is terminated or revoked sooner.     Influenza A by PCR NEGATIVE NEGATIVE Final   Influenza B by PCR NEGATIVE NEGATIVE Final    Comment: (NOTE) The Xpert Xpress SARS-CoV-2/FLU/RSV plus assay is intended as an aid in the diagnosis of influenza from Nasopharyngeal swab specimens and should not be used as a sole basis for treatment. Nasal washings and aspirates are unacceptable for Xpert Xpress SARS-CoV-2/FLU/RSV testing.  Fact Sheet for Patients: BloggerCourse.com  Fact Sheet for Healthcare Providers: SeriousBroker.it  This test is not yet approved or cleared by the [place]  FDA and has been authorized for detection and/or diagnosis of SARS-CoV-2 by FDA under an Emergency Use Authorization (EUA). This EUA will remain in effect (meaning this test can be used) for the duration of the COVID-19 declaration under Section 564(b)(1) of the Act, 21 U.S.C. section 360bbb-3(b)(1), unless the authorization is terminated or revoked.  Performed at [facility], [address] [place], [place] [address]   Group A Strep by PCR [facility] Only)     Status: None   Collection Time: 08/08/21 12:53 AM   Specimen: Nasopharyngeal Swab; Sterile Swab  Result Value Ref Range Status   Group A Strep by PCR NOT DETECTED NOT DETECTED Final    Comment: Performed at [facility], [address] [place], [place] [address]   MRI Brain 03/01/2021 IMPRESSION: Focal 7 mm lesion in the medial left posterior frontal lobe centered in the cortex with full description as above. Differential diagnosis includes calcified tuber, calcified sequela of neurocysticercosis, thrombosed  cavernoma and calcified low-grade brain tumor. Does the patient have older imaging performed elsewhere? If not, I would suggest a head CT to assess for calcification. In any case, this abnormality could serve as a seizure focus.  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 Patients questions were addressed and answered. Safe sex counseling done.  I spent 31 minutes involved in face-to-face and non-face-to-face activities for this patient on the day of the visit. Professional time spent includes the following activities: Preparing to see the patient (review of tests), Obtaining and reviewing separately obtained history ( ED notes 12/07/23), Performing a medically appropriate examination and evaluation , Ordering medications/labs,Documenting clinical information in the EMR, Independently interpreting results (not separately reported), Communicating results to the patient, Counseling and educating the patient and Care coordination (not separately reported).   Electronically signed by: Terre Ferri, MD Infectious Disease Physician [facility] [address] [place], [place] [address] Phone: [phone]  Fax: [phone]

## 2024-04-01 LAB — C. TRACHOMATIS/N. GONORRHOEAE RNA
C. trachomatis RNA, TMA: NOT DETECTED
N. gonorrhoeae RNA, TMA: NOT DETECTED

## 2024-04-03 ENCOUNTER — Ambulatory Visit: Payer: Self-pay | Admitting: Infectious Diseases

## 2024-04-03 LAB — COMPREHENSIVE METABOLIC PANEL WITH GFR
AG Ratio: 1.5 (calc) (ref 1.0–2.5)
ALT: 18 U/L (ref 9–46)
AST: 19 U/L (ref 10–35)
Albumin: 4.6 g/dL (ref 3.6–5.1)
Alkaline phosphatase (APISO): 55 U/L (ref 35–144)
BUN: 10 mg/dL (ref 7–25)
CO2: 28 mmol/L (ref 20–32)
Calcium: 9.4 mg/dL (ref 8.6–10.3)
Chloride: 105 mmol/L (ref 98–110)
Creat: 0.86 mg/dL (ref 0.70–1.30)
Globulin: 3 g/dL (ref 1.9–3.7)
Glucose, Bld: 91 mg/dL (ref 65–99)
Potassium: 4.9 mmol/L (ref 3.5–5.3)
Sodium: 142 mmol/L (ref 135–146)
Total Bilirubin: 0.3 mg/dL (ref 0.2–1.2)
Total Protein: 7.6 g/dL (ref 6.1–8.1)
eGFR: 105 mL/min/{1.73_m2} (ref >=60)

## 2024-04-03 LAB — CBC
HCT: 47.5 % (ref 38.5–50.0)
Hemoglobin: 15.4 g/dL (ref 13.2–17.1)
MCH: 32.2 pg (ref 27.0–33.0)
MCHC: 32.4 g/dL (ref 32.0–36.0)
MCV: 99.4 fL (ref 80.0–100.0)
MPV: 13 fL — ABNORMAL HIGH (ref 7.5–12.5)
Platelets: 153 10*3/uL (ref 140–400)
RBC: 4.78 10*6/uL (ref 4.20–5.80)
RDW: 12.6 % (ref 11.0–15.0)
WBC: 6.9 10*3/uL (ref 3.8–10.8)

## 2024-04-03 LAB — LIPID PANEL
Cholesterol: 154 mg/dL (ref <200)
HDL: 61 mg/dL (ref >=40)
LDL Cholesterol (Calc): 72 mg/dL
Non-HDL Cholesterol (Calc): 93 mg/dL (ref <130)
Total CHOL/HDL Ratio: 2.5 (calc) (ref <5.0)
Triglycerides: 120 mg/dL (ref <150)

## 2024-04-03 LAB — HIV RNA, RTPCR W/R GT (RTI, PI,INT)
HIV 1 RNA Quant: NOT DETECTED {copies}/mL
HIV-1 RNA Quant, Log: NOT DETECTED {Log_copies}/mL

## 2024-04-03 LAB — T-HELPER CELLS (CD4) COUNT (NOT AT ARMC)
Absolute CD4: 527 {cells}/uL (ref 490–1740)
CD4 T Helper %: 33 % (ref 30–61)
Total lymphocyte count: 1595 {cells}/uL (ref 850–3900)

## 2024-04-03 LAB — RPR: RPR Ser Ql: NONREACTIVE

## 2024-07-01 ENCOUNTER — Other Ambulatory Visit: Payer: Self-pay | Admitting: Infectious Diseases

## 2024-07-16 ENCOUNTER — Other Ambulatory Visit: Payer: Self-pay | Admitting: Infectious Diseases

## 2024-08-15 ENCOUNTER — Other Ambulatory Visit: Payer: Self-pay | Admitting: Infectious Diseases

## 2024-08-15 NOTE — Telephone Encounter (Signed)
 Do you want to refill?

## 2024-09-04 ENCOUNTER — Ambulatory Visit: Payer: Self-pay | Admitting: Infectious Diseases

## 2024-09-17 ENCOUNTER — Ambulatory Visit: Payer: Self-pay | Admitting: Family

## 2024-09-17 ENCOUNTER — Encounter: Payer: Self-pay | Admitting: Family

## 2024-09-17 ENCOUNTER — Other Ambulatory Visit: Payer: Self-pay

## 2024-09-17 VITALS — BP 127/78 | HR 79 | Temp 97.4°F | Wt 127.4 lb

## 2024-09-17 DIAGNOSIS — Z0000 Encounter for general adult medical examination without abnormal findings: Secondary | ICD-10-CM

## 2024-09-17 DIAGNOSIS — R569 Unspecified convulsions: Secondary | ICD-10-CM

## 2024-09-17 DIAGNOSIS — Z79899 Other long term (current) drug therapy: Secondary | ICD-10-CM

## 2024-09-17 DIAGNOSIS — B20 Human immunodeficiency virus [HIV] disease: Secondary | ICD-10-CM

## 2024-09-17 MED ORDER — DIVALPROEX SODIUM ER 500 MG PO TB24: 500.0000 mg | ORAL_TABLET | Freq: Every day | ORAL | 1 refills | Status: DC

## 2024-09-17 MED ORDER — GENVOYA 150-150-200-10 MG PO TABS: 1.0000 | ORAL_TABLET | Freq: Every day | ORAL | 5 refills | Status: AC

## 2024-09-17 MED ORDER — DIVALPROEX SODIUM ER 500 MG PO TB24: 500.0000 mg | ORAL_TABLET | Freq: Every day | ORAL | 3 refills | Status: AC

## 2024-09-17 NOTE — Patient Instructions (Addendum)
Nice to see you.  We will check your lab work today.  Continue to take your medication daily as prescribed.  Refills have been sent to the pharmacy.  Please call [facility] ([facility]) to schedule/follow up on your dental care at [phone]  Plan for follow up in 6 months or sooner if needed with lab work on the same day.   Have a great day and stay safe!  

## 2024-09-17 NOTE — Assessment & Plan Note (Signed)
 Mr. Maglione continues to have well-controlled virus with good adherence and tolerance to Genvoya .  Reviewed previous lab work and discussed plan of care and U equals U.  No problems obtaining medication from the pharmacy and covered by ADAP which has been renewed for next year.  Social determinants of health reviewed with no interventions indicated.  Check blood work.  Continue current dose of Genvoya .  Plan for follow-up in 6 months or sooner if needed with lab work on the same day.

## 2024-09-17 NOTE — Assessment & Plan Note (Signed)
 Mr. Moring remains on Depakote  for seizure management with no adverse side effects or seizures. Will check Depakote  level today. Encouraged to follow up with Neurology to determine appropriateness of current medication and further management of seizures.

## 2024-09-17 NOTE — Progress Notes (Signed)
 Brief Narrative   Patient ID: Mitchell Leonard, male    DOB: [birth year], 53 y.o.   MRN: [id number]  Mitchell Leonard is a 53 y/o African gentleman initially diagnosed with HIV in 2011 with risk factor of heterosexual contact. CD4 nadir was 16. No known history of opportunistic infection. [id number] negative. ART experience with Atripla and now Genvoya .   Subjective:   Chief Complaint  Patient presents with   Follow-up    Refill on Genvoya + Depakot    HPI:  Mitchell Leonard is a 53 y.o. male with HIV disease last seen on 03/31/2024 by Dr. Dea with well-controlled virus and good adherence and tolerance to Genvoya .  Viral load was undetectable with CD4 count 527.  Kidney function, liver function, electrolytes within normal ranges.  Lipid profile with triglycerides 120, LDL 72, and HDL 61.  He is also maintained on Depakote  for seizure management with encouragement to follow-up with neurology.  Here today for follow-up.  Mitchell Leonard has been doing well since his last office visit and continues to take Genvoya  and Depakote  as prescribed with no adverse side effects or problems obtaining medication from the pharmacy.  Covered by [employer] and has renewed financial assistance.  No new concerns/complaints.  No seizures to report.  Working full-time with stable housing, transportation, and access to food.  Healthcare maintenance reviewed.  Currently sexually active with condoms and site-specific STD testing offered.  Due for routine dental care.  Denies fevers, chills, night sweats, headaches, changes in vision, neck pain/stiffness, nausea, diarrhea, vomiting, lesions or rashes.  Lab Results  Component Value Date   CD4TCELL 33 03/31/2024   CD4TABS 380 (L) 10/12/2022   Lab Results  Component Value Date   HIV1RNAQUANT NOT DETECTED 03/31/2024     No Known Allergies    Outpatient Medications Prior to Visit  Medication Sig Dispense Refill   divalproex  (DEPAKOTE  ER) 500 MG 24 hr tablet Take 1  tablet (500 mg total) by mouth daily. 30 tablet 0   elvitegravir-cobicistat-emtricitabine-tenofovir (GENVOYA ) 150-150-200-10 MG TABS tablet Take 1 tablet by mouth daily with breakfast. 30 tablet 5   GENVOYA  150-150-200-10 MG TABS tablet TAKE 1 TABLET BY MOUTH DAILY WITH BREAKFAST 30 tablet 1   mometasone  (ELOCON ) 0.1 % lotion Apply topically daily. To affected area to your legs (Patient not taking: Reported on 09/17/2024) 60 mL 0   No facility-administered medications prior to visit.     Past Medical History:  Diagnosis Date   HIV (human immunodeficiency virus infection) (HCC)    Seizure disorder, secondary (HCC)      No past surgical history on file.      Review of Systems  Constitutional:  Negative for appetite change, chills, fatigue, fever and unexpected weight change.  Eyes:  Negative for visual disturbance.  Respiratory:  Negative for cough, chest tightness, shortness of breath and wheezing.   Cardiovascular:  Negative for chest pain and leg swelling.  Gastrointestinal:  Negative for abdominal pain, constipation, diarrhea, nausea and vomiting.  Genitourinary:  Negative for dysuria, flank pain, frequency, genital sores, hematuria and urgency.  Skin:  Negative for rash.  Allergic/Immunologic: Negative for immunocompromised state.  Neurological:  Negative for dizziness and headaches.     Objective:   BP 127/78   Pulse 79   Temp (!) 97.4 F (36.3 C) (Oral)   Wt 127 lb 6.4 oz (57.8 kg)   SpO2 98%   BMI 20.56 kg/m  Nursing note and vital signs reviewed.  Physical Exam Constitutional:  General: He is not in acute distress.    Appearance: He is well-developed.  Eyes:     Conjunctiva/sclera: Conjunctivae normal.  Cardiovascular:     Rate and Rhythm: Normal rate and regular rhythm.     Heart sounds: Normal heart sounds. No murmur heard.    No friction rub. No gallop.  Pulmonary:     Effort: Pulmonary effort is normal. No respiratory distress.     Breath  sounds: Normal breath sounds. No wheezing or rales.  Chest:     Chest wall: No tenderness.  Abdominal:     General: Bowel sounds are normal.     Palpations: Abdomen is soft.     Tenderness: There is no abdominal tenderness.  Musculoskeletal:     Cervical back: Neck supple.  Lymphadenopathy:     Cervical: No cervical adenopathy.  Skin:    General: Skin is warm and dry.     Findings: No rash.  Neurological:     Mental Status: He is alert and oriented to person, place, and time.          10/12/2022    8:38 AM 08/30/2021   10:33 AM 10/21/2020   11:18 AM 09/16/2019   11:31 AM 05/08/2019   10:59 AM  Depression screen PHQ 2/9  Decreased Interest 0 0 0 0 0  Down, Depressed, Hopeless 0 0 0 0 0  PHQ - 2 Score 0 0 0 0 0         No data to display           The 10-year ASCVD risk score (Arnett DK, et al., 2019) is: 5%   Values used to calculate the score:     Age: 50 years     Clincally relevant sex: Male     Is Non-Hispanic African American: Yes     Diabetic: No     Tobacco smoker: No     Systolic Blood Pressure: 127 mmHg     Is BP treated: No     HDL Cholesterol: 61 mg/dL     Total Cholesterol: 154 mg/dL      Assessment & Plan:    Patient Active Problem List   Diagnosis Date Noted   Immunization counseling 10/12/2022   Screening for STDs (sexually transmitted diseases) 10/12/2022   Medication management 10/12/2022   Arthralgia of left temporomandibular joint 09/16/2019   Weight loss 02/13/2019   Healthcare maintenance 02/13/2019   HIV disease (HCC) 10/21/2015   Seizures (HCC) 10/21/2015   GERD (gastroesophageal reflux disease) 10/21/2015     Problem List Items Addressed This Visit       Other   HIV disease (HCC) - Primary   Mitchell Leonard continues to have well-controlled virus with good adherence and tolerance to Genvoya .  Reviewed previous lab work and discussed plan of care and U equals U.  No problems obtaining medication from the pharmacy and covered  by [employer] which has been renewed for next year.  Social determinants of health reviewed with no interventions indicated.  Check blood work.  Continue current dose of Genvoya .  Plan for follow-up in 6 months or sooner if needed with lab work on the same day.      Relevant Medications   elvitegravir-cobicistat-emtricitabine-tenofovir (GENVOYA ) 150-150-200-10 MG TABS tablet   Other Relevant Orders   Comprehensive metabolic panel with GFR   HIV-1 RNA quant-no reflex-bld   T-helper cells (CD4) count (not at [facility])   Seizures [facility])   Mitchell Leonard remains on Depakote  for  seizure management with no adverse side effects or seizures. Will check Depakote  level today. Encouraged to follow up with Neurology to determine appropriateness of current medication and further management of seizures.       Relevant Medications   divalproex  (DEPAKOTE  ER) 500 MG 24 hr tablet   Other Relevant Orders   Valproic Acid  level   Healthcare maintenance   Discussed importance of safe sexual practice and condom use. Condoms and site specific STD testing offered.  Vaccinations reviewed and declined following counseling.  Due for routine dental care and referral placed to [facility].  Due for colon cancer screening and encouraged to contact Gastroenterology.         I have discontinued Ronold Atkison's mometasone . I am also having him maintain his Genvoya  and divalproex .   Meds ordered this encounter  Medications   elvitegravir-cobicistat-emtricitabine-tenofovir (GENVOYA ) 150-150-200-10 MG TABS tablet    Sig: Take 1 tablet by mouth daily with breakfast.    Dispense:  30 tablet    Refill:  5    Supervising Provider:   SNIDER, CYNTHIA [phone]    Prescription Type::   Renewal   DISCONTD: divalproex  (DEPAKOTE  ER) 500 MG 24 hr tablet    Sig: Take 1 tablet (500 mg total) by mouth daily.    Dispense:  30 tablet    Refill:  1    Additional refills to come from patient PCP    Supervising Provider:   LUIZ CHANNEL [id number]    divalproex  (DEPAKOTE  ER) 500 MG 24 hr tablet    Sig: Take 1 tablet (500 mg total) by mouth daily.    Dispense:  30 tablet    Refill:  3    Additional refills to come from patient PCP - please replace previous prescription    Supervising Provider:   LUIZ CHANNEL [[id number]]     Follow-up: Return in about 6 months (around 03/17/2025). or sooner if needed.    Cathlyn July, MSN, FNP-C Nurse Practitioner [facility] Main number: [phone]

## 2024-09-17 NOTE — Assessment & Plan Note (Signed)
 Discussed importance of safe sexual practice and condom use. Condoms and site specific STD testing offered.  Vaccinations reviewed and declined following counseling.  Due for routine dental care and referral placed to [facility].  Due for colon cancer screening and encouraged to contact Gastroenterology.

## 2024-09-23 LAB — COMPREHENSIVE METABOLIC PANEL WITH GFR
AG Ratio: 1.7 (calc) (ref 1.0–2.5)
ALT: 18 U/L (ref 9–46)
AST: 20 U/L (ref 10–35)
Albumin: 4.9 g/dL (ref 3.6–5.1)
Alkaline phosphatase (APISO): 62 U/L (ref 35–144)
BUN: 8 mg/dL (ref 7–25)
CO2: 28 mmol/L (ref 20–32)
Calcium: 9.7 mg/dL (ref 8.6–10.3)
Chloride: 101 mmol/L (ref 98–110)
Creat: 0.81 mg/dL (ref 0.70–1.30)
Globulin: 2.9 g/dL (ref 1.9–3.7)
Glucose, Bld: 89 mg/dL (ref 65–99)
Potassium: 4.5 mmol/L (ref 3.5–5.3)
Sodium: 140 mmol/L (ref 135–146)
Total Bilirubin: 0.3 mg/dL (ref 0.2–1.2)
Total Protein: 7.8 g/dL (ref 6.1–8.1)
eGFR: 106 mL/min/1.73m2 (ref >=60)

## 2024-09-23 LAB — HIV-1 RNA QUANT-NO REFLEX-BLD
HIV 1 RNA Quant: NOT DETECTED {copies}/mL
HIV-1 RNA Quant, Log: NOT DETECTED {Log_copies}/mL

## 2024-09-23 LAB — T-HELPER CELLS (CD4) COUNT (NOT AT ARMC)
Absolute CD4: 860 {cells}/uL (ref 490–1740)
CD4 T Helper %: 38 % (ref 30–61)
Total lymphocyte count: 2249 {cells}/uL (ref 850–3900)

## 2024-09-23 LAB — VALPROIC ACID LEVEL: Valproic Acid Lvl: 51.5 mg/L (ref 50.0–100.0)

## 2024-09-26 ENCOUNTER — Ambulatory Visit: Payer: Self-pay | Admitting: Family

## 2025-03-17 ENCOUNTER — Ambulatory Visit: Payer: Self-pay | Admitting: Infectious Diseases
